# Patient Record
Sex: Male | Born: 1975
Health system: Southern US, Community
[De-identification: ages and names within clinical notes are randomized; demographics above are authoritative.]

---

## 2020-06-19 ENCOUNTER — Inpatient Hospital Stay (HOSPITAL_COMMUNITY): Payer: HRSA Program

## 2020-06-19 ENCOUNTER — Other Ambulatory Visit: Payer: Self-pay

## 2020-06-19 ENCOUNTER — Emergency Department (HOSPITAL_COMMUNITY): Payer: HRSA Program

## 2020-06-19 ENCOUNTER — Inpatient Hospital Stay (HOSPITAL_COMMUNITY)
Admission: EM | Admit: 2020-06-19 | Discharge: 2020-06-26 | DRG: 177 | Disposition: A | Discharge status: Home / Self Care | Payer: HRSA Program | Attending: Internal Medicine | Admitting: Internal Medicine

## 2020-06-19 ENCOUNTER — Encounter (HOSPITAL_COMMUNITY): Payer: Self-pay | Admitting: Emergency Medicine

## 2020-06-19 DIAGNOSIS — R7401 Elevation of levels of liver transaminase levels: Secondary | ICD-10-CM | POA: Diagnosis present

## 2020-06-19 DIAGNOSIS — G4700 Insomnia, unspecified: Secondary | ICD-10-CM | POA: Diagnosis present

## 2020-06-19 DIAGNOSIS — R7989 Other specified abnormal findings of blood chemistry: Secondary | ICD-10-CM | POA: Diagnosis present

## 2020-06-19 DIAGNOSIS — J1282 Pneumonia due to coronavirus disease 2019: Secondary | ICD-10-CM | POA: Diagnosis present

## 2020-06-19 DIAGNOSIS — R7881 Bacteremia: Secondary | ICD-10-CM | POA: Diagnosis not present

## 2020-06-19 DIAGNOSIS — U071 COVID-19: Principal | ICD-10-CM

## 2020-06-19 DIAGNOSIS — J9601 Acute respiratory failure with hypoxia: Secondary | ICD-10-CM | POA: Diagnosis present

## 2020-06-19 DIAGNOSIS — N179 Acute kidney failure, unspecified: Secondary | ICD-10-CM | POA: Diagnosis present

## 2020-06-19 LAB — HEPATITIS B SURFACE ANTIGEN: Hepatitis B Surface Ag: NONREACTIVE

## 2020-06-19 LAB — CBG MONITORING, ED: Glucose-Capillary: 153 mg/dL — ABNORMAL HIGH (ref 70–99)

## 2020-06-19 LAB — COMPREHENSIVE METABOLIC PANEL
ALT: 97 U/L — ABNORMAL HIGH (ref 0–44)
AST: 123 U/L — ABNORMAL HIGH (ref 15–41)
Albumin: 3.2 g/dL — ABNORMAL LOW (ref 3.5–5.0)
Alkaline Phosphatase: 62 U/L (ref 38–126)
Anion gap: 12 (ref 5–15)
BUN: 22 mg/dL — ABNORMAL HIGH (ref 6–20)
CO2: 27 mmol/L (ref 22–32)
Calcium: 8.3 mg/dL — ABNORMAL LOW (ref 8.9–10.3)
Chloride: 94 mmol/L — ABNORMAL LOW (ref 98–111)
Creatinine, Ser: 1.59 mg/dL — ABNORMAL HIGH (ref 0.61–1.24)
GFR calc Af Amer: >60 mL/min (ref >60)
GFR calc non Af Amer: 52 mL/min — ABNORMAL LOW (ref >60)
Glucose, Bld: 152 mg/dL — ABNORMAL HIGH (ref 70–99)
Potassium: 3.7 mmol/L (ref 3.5–5.1)
Sodium: 133 mmol/L — ABNORMAL LOW (ref 135–145)
Total Bilirubin: 1 mg/dL (ref 0.3–1.2)
Total Protein: 7 g/dL (ref 6.5–8.1)

## 2020-06-19 LAB — CBC WITH DIFFERENTIAL/PLATELET
Abs Immature Granulocytes: 0.02 10*3/uL (ref 0.00–0.07)
Basophils Absolute: 0 10*3/uL (ref 0.0–0.1)
Basophils Relative: 0 %
Eosinophils Absolute: 0 10*3/uL (ref 0.0–0.5)
Eosinophils Relative: 0 %
HCT: 47.9 % (ref 39.0–52.0)
Hemoglobin: 15.8 g/dL (ref 13.0–17.0)
Immature Granulocytes: 0 %
Lymphocytes Relative: 27 %
Lymphs Abs: 1.3 10*3/uL (ref 0.7–4.0)
MCH: 29.1 pg (ref 26.0–34.0)
MCHC: 33 g/dL (ref 30.0–36.0)
MCV: 88.2 fL (ref 80.0–100.0)
Monocytes Absolute: 0.8 10*3/uL (ref 0.1–1.0)
Monocytes Relative: 16 %
Neutro Abs: 2.7 10*3/uL (ref 1.7–7.7)
Neutrophils Relative %: 57 %
Platelets: 240 10*3/uL (ref 150–400)
RBC: 5.43 MIL/uL (ref 4.22–5.81)
RDW: 12.5 % (ref 11.5–15.5)
WBC: 4.8 10*3/uL (ref 4.0–10.5)
nRBC: 0 % (ref 0.0–0.2)

## 2020-06-19 LAB — I-STAT ARTERIAL BLOOD GAS, ED
Acid-Base Excess: 5 mmol/L — ABNORMAL HIGH (ref 0.0–2.0)
Bicarbonate: 29.5 mmol/L — ABNORMAL HIGH (ref 20.0–28.0)
Calcium, Ion: 1.09 mmol/L — ABNORMAL LOW (ref 1.15–1.40)
HCT: 45 % (ref 39.0–52.0)
Hemoglobin: 15.3 g/dL (ref 13.0–17.0)
O2 Saturation: 86 %
Patient temperature: 100.3
Potassium: 3.5 mmol/L (ref 3.5–5.1)
Sodium: 135 mmol/L (ref 135–145)
TCO2: 31 mmol/L (ref 22–32)
pCO2 arterial: 42.9 mmHg (ref 32.0–48.0)
pH, Arterial: 7.448 (ref 7.350–7.450)
pO2, Arterial: 51 mmHg — ABNORMAL LOW (ref 83.0–108.0)

## 2020-06-19 LAB — LACTATE DEHYDROGENASE: LDH: 588 U/L — ABNORMAL HIGH (ref 98–192)

## 2020-06-19 LAB — URINALYSIS, ROUTINE W REFLEX MICROSCOPIC
Bilirubin Urine: NEGATIVE
Glucose, UA: NEGATIVE mg/dL
Hgb urine dipstick: NEGATIVE
Ketones, ur: NEGATIVE mg/dL
Leukocytes,Ua: NEGATIVE
Nitrite: NEGATIVE
Protein, ur: 100 mg/dL — AB
Specific Gravity, Urine: 1.027 (ref 1.005–1.030)
pH: 5 (ref 5.0–8.0)

## 2020-06-19 LAB — SODIUM, URINE, RANDOM: Sodium, Ur: 20 mmol/L

## 2020-06-19 LAB — LACTIC ACID, PLASMA: Lactic Acid, Venous: 1.9 mmol/L (ref 0.5–1.9)

## 2020-06-19 LAB — SARS CORONAVIRUS 2 BY RT PCR (HOSPITAL ORDER, PERFORMED IN ~~LOC~~ HOSPITAL LAB): SARS Coronavirus 2: POSITIVE — AB

## 2020-06-19 LAB — FIBRINOGEN: Fibrinogen: 571 mg/dL — ABNORMAL HIGH (ref 210–475)

## 2020-06-19 LAB — TRIGLYCERIDES: Triglycerides: 131 mg/dL (ref <150)

## 2020-06-19 LAB — CREATININE, URINE, RANDOM: Creatinine, Urine: 275.99 mg/dL

## 2020-06-19 LAB — C-REACTIVE PROTEIN: CRP: 11.9 mg/dL — ABNORMAL HIGH (ref <1.0)

## 2020-06-19 LAB — PROCALCITONIN: Procalcitonin: 0.45 ng/mL

## 2020-06-19 LAB — STREP PNEUMONIAE URINARY ANTIGEN: Strep Pneumo Urinary Antigen: NEGATIVE

## 2020-06-19 LAB — D-DIMER, QUANTITATIVE: D-Dimer, Quant: 1.13 ug/mL-FEU — ABNORMAL HIGH (ref 0.00–0.50)

## 2020-06-19 LAB — HIV ANTIBODY (ROUTINE TESTING W REFLEX): HIV Screen 4th Generation wRfx: NONREACTIVE

## 2020-06-19 LAB — ABO/RH: ABO/RH(D): A POS

## 2020-06-19 LAB — FERRITIN: Ferritin: 1332 ng/mL — ABNORMAL HIGH (ref 24–336)

## 2020-06-19 MED ORDER — HYDROCOD POLST-CPM POLST ER 10-8 MG/5ML PO SUER: 5.0000 mL | Freq: Two times a day (BID) | ORAL | Status: DC | PRN

## 2020-06-19 MED ORDER — SODIUM CHLORIDE 0.9% FLUSH
3.0000 mL | Freq: Two times a day (BID) | INTRAVENOUS | Status: DC
  Administered 2020-06-20 – 2020-06-26 (×13): 3 mL via INTRAVENOUS

## 2020-06-19 MED ORDER — DEXAMETHASONE SODIUM PHOSPHATE 10 MG/ML IJ SOLN
6.0000 mg | Freq: Once | INTRAMUSCULAR | Status: AC
  Administered 2020-06-19: 6 mg via INTRAVENOUS
  Filled 2020-06-19: qty 1

## 2020-06-19 MED ORDER — DEXAMETHASONE 4 MG PO TABS: 6.0000 mg | ORAL_TABLET | ORAL | Status: DC

## 2020-06-19 MED ORDER — GUAIFENESIN-DM 100-10 MG/5ML PO SYRP: 10.0000 mL | ORAL_SOLUTION | ORAL | Status: DC | PRN

## 2020-06-19 MED ORDER — ACETAMINOPHEN 325 MG PO TABS: 650.0000 mg | ORAL_TABLET | Freq: Four times a day (QID) | ORAL | Status: DC | PRN

## 2020-06-19 MED ORDER — TECHNETIUM TO 99M ALBUMIN AGGREGATED
4.2000 | Freq: Once | INTRAVENOUS | Status: AC | PRN
  Administered 2020-06-19: 4.2 via INTRAVENOUS

## 2020-06-19 MED ORDER — SODIUM CHLORIDE 0.9 % IV SOLN: Freq: Once | INTRAVENOUS | Status: AC

## 2020-06-19 MED ORDER — SODIUM CHLORIDE 0.9 % IV SOLN
200.0000 mg | Freq: Once | INTRAVENOUS | Status: AC
  Administered 2020-06-19: 200 mg via INTRAVENOUS
  Filled 2020-06-19: qty 40

## 2020-06-19 MED ORDER — SODIUM CHLORIDE 0.9 % IV SOLN
2.0000 g | INTRAVENOUS | Status: DC
  Administered 2020-06-19 – 2020-06-22 (×4): 2 g via INTRAVENOUS
  Filled 2020-06-19 (×4): qty 20

## 2020-06-19 MED ORDER — ENOXAPARIN SODIUM 100 MG/ML ~~LOC~~ SOLN: 90.0000 mg | Freq: Two times a day (BID) | SUBCUTANEOUS | Status: DC

## 2020-06-19 MED ORDER — SODIUM CHLORIDE 0.9 % IV SOLN
500.0000 mg | INTRAVENOUS | Status: DC
  Administered 2020-06-19 – 2020-06-20 (×2): 500 mg via INTRAVENOUS
  Filled 2020-06-19 (×3): qty 500

## 2020-06-19 MED ORDER — ONDANSETRON HCL 4 MG PO TABS: 4.0000 mg | ORAL_TABLET | Freq: Four times a day (QID) | ORAL | Status: DC | PRN

## 2020-06-19 MED ORDER — ENOXAPARIN SODIUM 100 MG/ML ~~LOC~~ SOLN
90.0000 mg | Freq: Once | SUBCUTANEOUS | Status: DC
  Filled 2020-06-19: qty 0.9

## 2020-06-19 MED ORDER — ENOXAPARIN SODIUM 100 MG/ML ~~LOC~~ SOLN
90.0000 mg | Freq: Two times a day (BID) | SUBCUTANEOUS | Status: DC
  Administered 2020-06-19: 90 mg via SUBCUTANEOUS
  Filled 2020-06-19 (×2): qty 0.9

## 2020-06-19 MED ORDER — ENOXAPARIN SODIUM 100 MG/ML ~~LOC~~ SOLN
90.0000 mg | Freq: Once | SUBCUTANEOUS | Status: AC
  Administered 2020-06-19: 90 mg via SUBCUTANEOUS
  Filled 2020-06-19: qty 0.9

## 2020-06-19 MED ORDER — ZINC SULFATE 220 (50 ZN) MG PO CAPS
220.0000 mg | ORAL_CAPSULE | Freq: Every day | ORAL | Status: DC
  Administered 2020-06-19 – 2020-06-26 (×8): 220 mg via ORAL
  Filled 2020-06-19 (×8): qty 1

## 2020-06-19 MED ORDER — TOCILIZUMAB 400 MG/20ML IV SOLN
8.0000 mg/kg | Freq: Once | INTRAVENOUS | Status: AC
  Administered 2020-06-19: 726 mg via INTRAVENOUS
  Filled 2020-06-19: qty 20

## 2020-06-19 MED ORDER — ONDANSETRON HCL 4 MG/2ML IJ SOLN: 4.0000 mg | Freq: Four times a day (QID) | INTRAMUSCULAR | Status: DC | PRN

## 2020-06-19 MED ORDER — FAMOTIDINE 20 MG PO TABS
20.0000 mg | ORAL_TABLET | Freq: Two times a day (BID) | ORAL | Status: DC
  Administered 2020-06-19 – 2020-06-20 (×3): 20 mg via ORAL
  Filled 2020-06-19 (×3): qty 1

## 2020-06-19 MED ORDER — ALBUTEROL SULFATE HFA 108 (90 BASE) MCG/ACT IN AERS
2.0000 | INHALATION_SPRAY | Freq: Four times a day (QID) | RESPIRATORY_TRACT | Status: DC
  Administered 2020-06-19 – 2020-06-23 (×15): 2 via RESPIRATORY_TRACT
  Filled 2020-06-19 (×2): qty 6.7

## 2020-06-19 MED ORDER — SODIUM CHLORIDE 0.9 % IV SOLN
100.0000 mg | INTRAVENOUS | Status: AC
  Administered 2020-06-20 – 2020-06-23 (×4): 100 mg via INTRAVENOUS
  Filled 2020-06-19 (×4): qty 20

## 2020-06-19 MED ORDER — ASCORBIC ACID 500 MG PO TABS
500.0000 mg | ORAL_TABLET | Freq: Every day | ORAL | Status: DC
  Administered 2020-06-19 – 2020-06-26 (×8): 500 mg via ORAL
  Filled 2020-06-19 (×8): qty 1

## 2020-06-19 NOTE — H&P (Addendum)
History and Physical    Kenneth Hudson XNA:[id number] DOB: March 08, 1976 DOA: 06/19/2020  Referring MD/NP/PA: Alveria Apley, PA-C PCP: Patient, No Pcp Per  Patient coming from: Home via EMS  Chief Complaint: Shortness of breath  I have personally briefly reviewed patient's old medical records in [facility]   HPI: Kenneth Hudson is a 44 y.o. male 19+ past medical history significant for diagnosed with COVID-19 7/28 presents with progressively worsening shortness of breath.  Symptoms initially started 12 days ago and tested positive for COVID-19 virus 5 days ago.  Complains of associated symptoms of subjective fevers, chest pain, generalized malaise, intermittently productive cough, insomnia, and poor appetite.  Denies having any nausea, vomiting, diarrhea, calf pain symptoms.  He denies having any medical problems, but does not regularly see a primary care physician.  Notes that he never got around to get the COVID-19 vaccine.  He notes that none of his family lives nearby and he will update them.  ED Course: Upon admission into the emergency department patient was seen to have a temperature of 100.3 F, pulse 95-1 04, respirations 28-37, and O2 saturations as low as 35% and currently lower 90s on nonrebreather and he did high flow nasal cannula oxygen. Labs noted CBC within normal limits, sodium 133, BUN 22, creatinine 1.59, glucose 152, AST 123, ALT 97, lactic acid 1.9, LDH 588, D-dimer 1.13, and fibrinogen 571. Patient was started on remdesivir and given Decadron 6 mg IV. TRH called to admit  Review of Systems  Constitutional: Positive for fever and malaise/fatigue.  HENT: Negative for ear pain and tinnitus.   Eyes: Negative for photophobia and pain.  Respiratory: Positive for cough, sputum production and shortness of breath.   Cardiovascular: Positive for chest pain. Negative for leg swelling.  Gastrointestinal: Negative for abdominal pain, diarrhea, nausea and vomiting.  Genitourinary:  Negative for dysuria and urgency.  Musculoskeletal: Positive for myalgias. Negative for falls.  Skin: Negative for itching and rash.  Neurological: Positive for weakness. Negative for focal weakness and loss of consciousness.  Psychiatric/Behavioral: Negative for substance abuse. The patient has insomnia.     History reviewed. No pertinent past medical history.  History reviewed. No pertinent surgical history.   reports that he has never smoked. He has never used smokeless tobacco. He reports current alcohol use. He reports that he does not use drugs.  Not on File  No family history on file.  Prior to Admission medications   Not on File    Physical Exam:  Constitutional: Middle-age male who appears acutely ill in some respiratory distress Vitals:   06/19/20 0930 06/19/20 0945 06/19/20 1000 06/19/20 1045  BP: (!) 137/83 (!) 132/75 123/74 (!) 134/86  Pulse: 98 98 101 95  Resp: (!) 28 (!) 29 (!) 36 (!) 36  Temp:      TempSrc:      SpO2: 94% 91% 92% (!) 89%  Weight:       Eyes: PERRL, lids and conjunctivae normal ENMT: Mucous membranes are moist. Posterior pharynx clear of any exudate or lesions.  Neck: normal, supple, no masses, no thyromegaly Respiratory:Tachypneic with decreased aeration no significant wheezes rhonchi appreciated. Cardiovascular: Regular rate and rhythm, no murmurs / rubs / gallops. No extremity edema. 2+ pedal pulses. No carotid bruits.  Abdomen: no tenderness, no masses palpated. No hepatosplenomegaly. Bowel sounds positive.  Musculoskeletal: no clubbing / cyanosis. No joint deformity upper and lower extremities. Good ROM, no contractures. Normal muscle tone.  Skin: no rashes, lesions, ulcers. No induration  Neurologic: CN 2-12 grossly intact. Sensation intact, DTR normal. Strength 5/5 in all 4.  Psychiatric: Normal judgment and insight. Alert and oriented x 3. Normal mood.     Labs on Admission: I have personally reviewed following labs and imaging  studies  CBC: Recent Labs  Lab 06/19/20 0935  WBC 4.8  NEUTROABS 2.7  HGB 15.8  HCT 47.9  MCV 88.2  PLT 240   Basic Metabolic Panel: Recent Labs  Lab 06/19/20 0935  NA 133*  K 3.7  CL 94*  CO2 27  GLUCOSE 152*  BUN 22*  CREATININE 1.59*  CALCIUM 8.3*   GFR: CrCl cannot be calculated (Unknown ideal weight.). Liver Function Tests: Recent Labs  Lab 06/19/20 0935  AST 123*  ALT 97*  ALKPHOS 62  BILITOT 1.0  PROT 7.0  ALBUMIN 3.2*   No results for input(s): LIPASE, AMYLASE in the last 168 hours. No results for input(s): AMMONIA in the last 168 hours. Coagulation Profile: No results for input(s): INR, PROTIME in the last 168 hours. Cardiac Enzymes: No results for input(s): CKTOTAL, CKMB, CKMBINDEX, TROPONINI in the last 168 hours. BNP (last 3 results) No results for input(s): PROBNP in the last 8760 hours. HbA1C: No results for input(s): HGBA1C in the last 72 hours. CBG: Recent Labs  Lab 06/19/20 0857  GLUCAP 153*   Lipid Profile: No results for input(s): CHOL, HDL, LDLCALC, TRIG, CHOLHDL, LDLDIRECT in the last 72 hours. Thyroid Function Tests: No results for input(s): TSH, T4TOTAL, FREET4, T3FREE, THYROIDAB in the last 72 hours. Anemia Panel: No results for input(s): VITAMINB12, FOLATE, FERRITIN, TIBC, IRON, RETICCTPCT in the last 72 hours. Urine analysis: No results found for: COLORURINE, APPEARANCEUR, LABSPEC, PHURINE, GLUCOSEU, HGBUR, BILIRUBINUR, KETONESUR, PROTEINUR, UROBILINOGEN, NITRITE, LEUKOCYTESUR Sepsis Labs: No results found for this or any previous visit (from the past 240 hour(s)).   Radiological Exams on Admission: DG Chest Port 1 View  Result Date: 06/19/2020 CLINICAL DATA:  Shortness of breath, COVID positive EXAM: PORTABLE CHEST 1 VIEW COMPARISON:  None FINDINGS: Trachea midline.  Lung volumes are low. Basilar consolidation on the LEFT with some areas of peripheral opacity in the LEFT chest lateral to the heart border. Linear opacities  also in the RIGHT mid chest. No acute musculoskeletal process to the extent evaluated. IMPRESSION: Multifocal opacities a pattern that could be seen in the setting multifocal infection or in the setting of COVID-19, asymmetric and more dense opacification in the retrocardiac region on today's exam. Electronically Signed   By: Donzetta Kohut M.D.   On: 06/19/2020 10:00    EKG: Independently reviewed. Sinus tachycardia at 103 bpm  Assessment/Plan Acute respiratory failure with hypoxia secondary to pneumonia due to COVID-19: Patient presented with 12 days of symptoms after recent being diagnosed with COVID-19 7/28. He was noted to be hypoxic down to 35% on room air requiring heated high flow nasal cannula oxygen and nonrebreather mask to maintain O2 saturations in the lower 90s. Chest x-ray significant for multifocal opacities concerning for COVID-19. Patient had been started on remdesivir and Decadron. Inflammatory markers including D-dimer -Admit to a progressive bed -COVID-19 admission order set utilize -Continuous pulse oximetry with oxygen to maintain O2 saturations greater than 90% -Follow-up ABG -Follow-up blood and sputum cultures -Continue remdesivir -Decadron 6 mg p.o. daily -Albuterol inhaler every 6 hours -Actemra x1 dose, reassess for need of 2nd dose -Added on Rocephin and azithromycin given elevated procalcitonin of 0.45 -Antitussives as needed cough/congestion -Vitamin C and zinc -Tylenol as needed for fever -Daily  monitoring of inflammatory markers  Elevated D-dimer: Acute. D-dimer elevated 1.13. Given the degree of axial question possibility of blood clot. -Lovenox per pharmacy -Check VQ scan  Acute kidney injury: On admission patient found to have a creatinine of 1.59 with BUN 22. Patient baseline creatinine unknown. -Check urine sodium and urine creatinine to calculate FeNa -Give normal saline IV fluids at 75 mL/h x 1 L -Continue to monitor kidney function  daily  Transaminitis: Acute.  AST 123 and ALT 97.  Patient denies daily use of alcohol. -Continue to monitor  DVT prophylaxis: Lovenox per pharmacy Code Status: Full Family Communication: No family quested to be updated Disposition Plan: To be determined Consults called: None Admission status: Inpatient  Clydie Braun MD [facility] Pager [phone]   If 7PM-7AM, please contact night-coverage [email or web address] Password [facility]  06/19/2020, 11:31 AM

## 2020-06-19 NOTE — ED Triage Notes (Signed)
Pt in via [facility] w/sob, dx Coivd+ last Wednesday. C/o fever, unable to catch breath, sats were 35% on RA per EMS. Arrived to ED 85% on 15L NRB

## 2020-06-19 NOTE — ED Notes (Signed)
Lupita Leash, sister, [phone] would like an update when available

## 2020-06-19 NOTE — Progress Notes (Signed)
ANTICOAGULATION CONSULT NOTE - Initial Consult  Pharmacy Consult for lovenox Indication: r/o PE  Not on File  Patient Measurements: Weight: 90.7 kg (200 lb)  Vital Signs: Temp: 100.3 F (37.9 C) (08/02 0902) Temp Source: Axillary (08/02 0902) BP: 133/90 (08/02 1200) Pulse Rate: 93 (08/02 1200)  Labs: Recent Labs    06/19/20 0935  HGB 15.8  HCT 47.9  PLT 240  CREATININE 1.59*    CrCl cannot be calculated (Unknown ideal weight.).  Assessment: 72 yom presented to the ED with shortness of breath due to recent COVID-19 diagnosis. To start lovenox for r/o PE. Baseline CBC is WNL. He is not on anticoagulation PTA.   Goal of Therapy:  Anti-Xa level 0.6-1 units/ml 4hrs after LMWH dose given Monitor platelets by anticoagulation protocol: Yes   Plan:  Lovenox 90mg  SQ Q12H CBC Q72H F/u VQ scan results  Bailee Metter, 06/19/2020,12:14 PM

## 2020-06-19 NOTE — Progress Notes (Signed)
RT NOTES: BIB EMS on 100% NRB, Sats 87%. Placed and 15L [place] HFNC under NRB, sats now 92%. Will continue to monitor.

## 2020-06-19 NOTE — ED Provider Notes (Signed)
[facility] EMERGENCY DEPARTMENT Provider Note   CSN: [id number] Arrival date & time: 06/19/20  5170     History Chief Complaint  Patient presents with   Covid+    Kenneth Hudson is a 44 y.o. male presenting for evaluation of shortness of breath.  Level 5 caveat due to acuity of condition  Patient states he was diagnosed with Covid last Wednesday.  He has had approximately 12 days of shortness of breath, worsened acutely last night.  No chest pain.  No other medical problems, takes medications daily.  Does not smoke cigarettes.  Additional history obtained from triage note.  Per triage note, patient was 35% on room air.  this improved to 85% on 15 L nonrebreather.  HPI     History reviewed. No pertinent past medical history.  There are no problems to display for this patient.   History reviewed. No pertinent surgical history.     No family history on file.  Social History   Tobacco Use   Smoking status: Never Smoker   Smokeless tobacco: Never Used  Substance Use Topics   Alcohol use: Yes   Drug use: Never    Home Medications Prior to Admission medications   Not on File    Allergies    Patient has no allergy information on record.  Review of Systems   Review of Systems  Unable to perform ROS: Acuity of condition  Constitutional: Positive for fever.  Respiratory: Positive for shortness of breath.   All other systems reviewed and are negative.   Physical Exam Updated Vital Signs BP (!) 134/86    Pulse 95    Temp 100.3 F (37.9 C) (Axillary)    Resp (!) 36    Wt 90.7 kg    SpO2 (!) 89%   Physical Exam Vitals and nursing note reviewed.  Constitutional:      Appearance: He is well-developed. He is ill-appearing.     Comments: Appears ill  HENT:     Head: Normocephalic and atraumatic.  Eyes:     Extraocular Movements: Extraocular movements intact.     Conjunctiva/sclera: Conjunctivae normal.     Pupils: Pupils are equal,  round, and reactive to light.  Cardiovascular:     Rate and Rhythm: Normal rate and regular rhythm.     Pulses: Normal pulses.  Pulmonary:     Effort: Pulmonary effort is normal. No respiratory distress.     Breath sounds: Normal breath sounds. No wheezing.     Comments: Tachypneic in the mid 30s.  Sats in the low 90s on HFNC and nonrebreather. Diminished lung sounds throughout  Abdominal:     General: There is no distension.     Palpations: Abdomen is soft. There is no mass.     Tenderness: There is no abdominal tenderness. There is no guarding or rebound.  Musculoskeletal:        General: Normal range of motion.     Cervical back: Normal range of motion and neck supple.     Right lower leg: No edema.     Left lower leg: No edema.  Skin:    General: Skin is warm and dry.     Capillary Refill: Capillary refill takes less than 2 seconds.  Neurological:     Mental Status: He is alert and oriented to person, place, and time.     ED Results / Procedures / Treatments   Labs (all labs ordered are listed, but only abnormal results are displayed)  Labs Reviewed  COMPREHENSIVE METABOLIC PANEL - Abnormal; Notable for the following components:      Result Value   Sodium 133 (*)    Chloride 94 (*)    Glucose, Bld 152 (*)    BUN 22 (*)    Creatinine, Ser 1.59 (*)    Calcium 8.3 (*)    Albumin 3.2 (*)    AST 123 (*)    ALT 97 (*)    GFR calc non Af Amer 52 (*)    All other components within normal limits  D-DIMER, QUANTITATIVE (NOT AT [facility]) - Abnormal; Notable for the following components:   D-Dimer, Quant 1.13 (*)    All other components within normal limits  LACTATE DEHYDROGENASE - Abnormal; Notable for the following components:   LDH 588 (*)    All other components within normal limits  FIBRINOGEN - Abnormal; Notable for the following components:   Fibrinogen 571 (*)    All other components within normal limits  CBG MONITORING, ED - Abnormal; Notable for the following  components:   Glucose-Capillary 153 (*)    All other components within normal limits  SARS CORONAVIRUS 2 BY RT PCR (HOSPITAL ORDER, PERFORMED IN [facility])  CULTURE, BLOOD (ROUTINE X 2)  CULTURE, BLOOD (ROUTINE X 2)  LACTIC ACID, PLASMA  CBC WITH DIFFERENTIAL/PLATELET  PROCALCITONIN  FERRITIN  TRIGLYCERIDES  C-REACTIVE PROTEIN  I-STAT ARTERIAL BLOOD GAS, ED    EKG None  Radiology DG Chest Port 1 View  Result Date: 06/19/2020 CLINICAL DATA:  Shortness of breath, COVID positive EXAM: PORTABLE CHEST 1 VIEW COMPARISON:  None FINDINGS: Trachea midline.  Lung volumes are low. Basilar consolidation on the LEFT with some areas of peripheral opacity in the LEFT chest lateral to the heart border. Linear opacities also in the RIGHT mid chest. No acute musculoskeletal process to the extent evaluated. IMPRESSION: Multifocal opacities a pattern that could be seen in the setting multifocal infection or in the setting of COVID-19, asymmetric and more dense opacification in the retrocardiac region on today's exam. Electronically Signed   By: Donzetta Kohut M.D.   On: 06/19/2020 10:00    Procedures .Critical Care Performed by: Alveria Apley, PA-C Authorized by: Alveria Apley, PA-C   Critical care provider statement:    Critical care time (minutes):  35   Critical care time was exclusive of:  Separately billable procedures and treating other patients and teaching time   Critical care was necessary to treat or prevent imminent or life-threatening deterioration of the following conditions:  Respiratory failure   Critical care was time spent personally by me on the following activities:  Blood draw for specimens, development of treatment plan with patient or surrogate, discussions with primary provider, evaluation of patient's response to treatment, examination of patient, obtaining history from patient or surrogate, ordering and performing treatments and interventions, ordering and  review of radiographic studies, ordering and review of laboratory studies, pulse oximetry, review of old charts and re-evaluation of patient's condition   I assumed direction of critical care for this patient from another provider in my specialty: no   Comments:     Patient very hypoxic and tachypneic in the setting of a Covid infection.  Requiring high flow nasal cannula and nonrebreather and admission to the hospital.   (including critical care time)  Medications Ordered in ED Medications  remdesivir 200 mg in sodium chloride 0.9% 250 mL IVPB (200 mg Intravenous New Bag/Given 06/19/20 1046)    Followed by  remdesivir 100 mg in sodium chloride 0.9 % 100 mL IVPB (has no administration in time range)  dexamethasone (DECADRON) injection 6 mg (6 mg Intravenous Given 06/19/20 1046)    ED Course  I have reviewed the triage vital signs and the nursing notes.  Pertinent labs & imaging results that were available during my care of the patient were reviewed by me and considered in my medical decision making (see chart for details).    MDM Rules/Calculators/A&P                          Patient presented for evaluation of fever and shortness of breath.  On exam, patient appears ill.  Room air sats of 35%.  This improved with HFNC and nonrebreather.  Patient will need to be admitted after labs and x-ray return.  Will give Decadron and remdesivir  Chest x-ray viewed interpreted by me, shows multifocal opacity consistent with Covid pneumonia.  Labs show elevated inflammatory markers consistent with Covid.  Patient remains in low 90s on high flow nasal cannula and nonrebreather, no distress at this time.  Will call for admission.  Discussed with Dr. Katrinka Blazing from [facility], patient to be admitted.  Final Clinical Impression(s) / ED Diagnoses Final diagnoses:  COVID-19  Acute respiratory failure with hypoxia [facility])    Rx / DC Orders ED Discharge Orders    None       Alveria Apley, PA-C 06/19/20 1135    Sabas Sous, MD 06/28/20 [phone]

## 2020-06-20 LAB — CBC WITH DIFFERENTIAL/PLATELET
Abs Immature Granulocytes: 0.02 10*3/uL (ref 0.00–0.07)
Basophils Absolute: 0 10*3/uL (ref 0.0–0.1)
Basophils Relative: 0 %
Eosinophils Absolute: 0 10*3/uL (ref 0.0–0.5)
Eosinophils Relative: 0 %
HCT: 45.7 % (ref 39.0–52.0)
Hemoglobin: 14.7 g/dL (ref 13.0–17.0)
Immature Granulocytes: 1 %
Lymphocytes Relative: 28 %
Lymphs Abs: 0.9 10*3/uL (ref 0.7–4.0)
MCH: 28.1 pg (ref 26.0–34.0)
MCHC: 32.2 g/dL (ref 30.0–36.0)
MCV: 87.4 fL (ref 80.0–100.0)
Monocytes Absolute: 0.6 10*3/uL (ref 0.1–1.0)
Monocytes Relative: 20 %
Neutro Abs: 1.6 10*3/uL — ABNORMAL LOW (ref 1.7–7.7)
Neutrophils Relative %: 51 %
Platelets: 257 10*3/uL (ref 150–400)
RBC: 5.23 MIL/uL (ref 4.22–5.81)
RDW: 12.3 % (ref 11.5–15.5)
WBC: 3.1 10*3/uL — ABNORMAL LOW (ref 4.0–10.5)
nRBC: 0 % (ref 0.0–0.2)

## 2020-06-20 LAB — COMPREHENSIVE METABOLIC PANEL
ALT: 91 U/L — ABNORMAL HIGH (ref 0–44)
AST: 93 U/L — ABNORMAL HIGH (ref 15–41)
Albumin: 3 g/dL — ABNORMAL LOW (ref 3.5–5.0)
Alkaline Phosphatase: 57 U/L (ref 38–126)
Anion gap: 8 (ref 5–15)
BUN: 21 mg/dL — ABNORMAL HIGH (ref 6–20)
CO2: 30 mmol/L (ref 22–32)
Calcium: 8.4 mg/dL — ABNORMAL LOW (ref 8.9–10.3)
Chloride: 97 mmol/L — ABNORMAL LOW (ref 98–111)
Creatinine, Ser: 1.2 mg/dL (ref 0.61–1.24)
GFR calc Af Amer: >60 mL/min (ref >60)
GFR calc non Af Amer: >60 mL/min (ref >60)
Glucose, Bld: 153 mg/dL — ABNORMAL HIGH (ref 70–99)
Potassium: 3.9 mmol/L (ref 3.5–5.1)
Sodium: 135 mmol/L (ref 135–145)
Total Bilirubin: 1.1 mg/dL (ref 0.3–1.2)
Total Protein: 6.6 g/dL (ref 6.5–8.1)

## 2020-06-20 LAB — LEGIONELLA PNEUMOPHILA SEROGP 1 UR AG: L. pneumophila Serogp 1 Ur Ag: NEGATIVE

## 2020-06-20 LAB — D-DIMER, QUANTITATIVE: D-Dimer, Quant: 0.9 ug/mL-FEU — ABNORMAL HIGH (ref 0.00–0.50)

## 2020-06-20 LAB — C-REACTIVE PROTEIN: CRP: 9.4 mg/dL — ABNORMAL HIGH (ref <1.0)

## 2020-06-20 LAB — FERRITIN: Ferritin: 1373 ng/mL — ABNORMAL HIGH (ref 24–336)

## 2020-06-20 LAB — MAGNESIUM: Magnesium: 2.6 mg/dL — ABNORMAL HIGH (ref 1.7–2.4)

## 2020-06-20 LAB — PHOSPHORUS: Phosphorus: 2.8 mg/dL (ref 2.5–4.6)

## 2020-06-20 LAB — GLUCOSE, CAPILLARY: Glucose-Capillary: 187 mg/dL — ABNORMAL HIGH (ref 70–99)

## 2020-06-20 MED ORDER — METHYLPREDNISOLONE SODIUM SUCC 125 MG IJ SOLR
80.0000 mg | Freq: Three times a day (TID) | INTRAMUSCULAR | Status: AC
  Administered 2020-06-20 (×3): 80 mg via INTRAVENOUS
  Filled 2020-06-20 (×3): qty 2

## 2020-06-20 MED ORDER — PANTOPRAZOLE SODIUM 40 MG PO TBEC
40.0000 mg | DELAYED_RELEASE_TABLET | Freq: Every day | ORAL | Status: DC
  Administered 2020-06-20 – 2020-06-26 (×7): 40 mg via ORAL
  Filled 2020-06-20 (×7): qty 1

## 2020-06-20 MED ORDER — METHYLPREDNISOLONE SODIUM SUCC 125 MG IJ SOLR
60.0000 mg | Freq: Two times a day (BID) | INTRAMUSCULAR | Status: DC
  Administered 2020-06-21 – 2020-06-26 (×11): 60 mg via INTRAVENOUS
  Filled 2020-06-20 (×11): qty 2

## 2020-06-20 MED ORDER — ENOXAPARIN SODIUM 60 MG/0.6ML ~~LOC~~ SOLN
60.0000 mg | SUBCUTANEOUS | Status: DC
  Administered 2020-06-20 – 2020-06-25 (×6): 60 mg via SUBCUTANEOUS
  Filled 2020-06-20 (×6): qty 0.6

## 2020-06-20 NOTE — Progress Notes (Signed)
Pt. Arrived from ED. AOx4. Call light within reach, patient able to voice, and demonstrate understanding.  Skin, clean-dry- intact without evidence of bruising, or skin tears.    No evidence of skin break down noted on exam.  Will cont to eval and treat per MD orders.  Quincy Carnes, RN 06/20/2020 9:29 AM

## 2020-06-20 NOTE — Progress Notes (Addendum)
PROGRESS NOTE                                                                                                                                                                                                             Patient Demographics:    Kenneth Hudson, is a 44 y.o. male, DOB - 15-May-1976, UDJ:[id number]  Admit date - 06/19/2020   Admitting Physician Clydie Braun, MD  Outpatient Primary MD for the patient is Patient, No Pcp Per  LOS - 1   Chief Complaint  Patient presents with  . Covid+       Brief Narrative    Kenneth Hudson is a 44 y.o. male 19+ past medical history significant for diagnosed with COVID-19 7/28 presents with progressively worsening shortness of breath.  Symptoms initially started 12 days ago and tested positive for COVID-19 virus 5 days ago.  Complains of associated symptoms of subjective fevers, chest pain, generalized malaise, intermittently productive cough, insomnia, and poor appetite.  Denies having any nausea, vomiting, diarrhea, calf pain symptoms.  He denies having any medical problems, but does not regularly see a primary care physician.  Notes that he never got around to get the COVID-19 vaccine.  He notes that none of his family lives nearby and he will update them. - Upon admission into the emergency department patient was seen to have a temperature of 100.3 F, pulse 95-1 04, respirations 28-37, and O2 saturations as low as 35% and currently lower 90s on nonrebreather and he did high flow nasal cannula oxygen. Labs noted CBC within normal limits, sodium 133, BUN 22, creatinine 1.59, glucose 152, AST 123, ALT 97, lactic acid 1.9, LDH 588, D-dimer 1.13, and fibrinogen 571. Patient was started on remdesivir and given Decadron 6 mg IV.    Subjective:    Kenneth Hudson today has, No headache, No chest pain, No abdominal pain , reports his dyspnea is improving, still reports some cough.   Assessment  & Plan :    Principal  Problem:   Acute respiratory failure with hypoxia (HCC) Active Problems:   Pneumonia due to COVID-19 virus   AKI (acute kidney injury) (HCC)   Elevated d-dimer   Transaminitis  Acute respiratory failure with hypoxia secondary to pneumonia due to COVID-19. -Patient with significant hypoxia, still requiring 15 L high flow nasal cannula, and 15  L NRB. -Discussed at length with the patient, he was encouraged to prone, out of bed to chair, to use incentive spirometry and flutter valve. -Patient received Actemra on admission. -Continue with steroids, change to IV Solu-Medrol. -Continue with IV remdesivir. -Continue to trend inflammatory markers closely -Pro Cal of 0.5, continue with Rocephin and azithromycin for next 24 hours, and discontinue if continues to trend down. -VQ scan with no evidence of PE, will change full dose anticoagulation to prophylaxis dose  Elevated D-dimer - -VQ scan with no evidence of PE, will change full dose anticoagulation to prophylaxis dose  Acute kidney injury:  -Improving with IV fluids, will reassess his oral intake and appetite, if sufficient will hold on further IV fluids.  Transaminitis: Acute.  -Due to Covid, trending down.    COVID-19 Labs  Recent Labs    06/19/20 0935 06/20/20 0539  DDIMER 1.13* 0.90*  FERRITIN 1,332* 1,373*  LDH 588*  --   CRP 11.9* 9.4*    Lab Results  Component Value Date   SARSCOV2NAA POSITIVE (A) 06/19/2020     Code Status : Full  Family Communication  : Patient updated.  Disposition Plan  :  Status is: Inpatient  Remains inpatient appropriate because:IV treatments appropriate due to intensity of illness or inability to take PO   Dispo: The patient is from: Home              Anticipated d/c is to: Home              Anticipated d/c date is: > 3 days              Patient currently is not medically stable to d/c.       Consults  :  None  Procedures  : None  DVT Prophylaxis  :  [place]  lovenox  Lab Results  Component Value Date   PLT 257 06/20/2020    Antibiotics  :   Anti-infectives (From admission, onward)   Start     Dose/Rate Route Frequency Ordered Stop   06/20/20 1000  remdesivir 100 mg in sodium chloride 0.9 % 100 mL IVPB     Discontinue    "Followed by" Linked Group Details   100 mg 200 mL/hr over 30 Minutes Intravenous Every 24 hours 06/19/20 0942 06/24/20 0959   06/19/20 1400  cefTRIAXone (ROCEPHIN) 2 g in sodium chloride 0.9 % 100 mL IVPB     Discontinue     2 g 200 mL/hr over 30 Minutes Intravenous Every 24 hours 06/19/20 1330 06/24/20 1359   06/19/20 1400  azithromycin (ZITHROMAX) 500 mg in sodium chloride 0.9 % 250 mL IVPB     Discontinue     500 mg 250 mL/hr over 60 Minutes Intravenous Every 24 hours 06/19/20 1330 06/24/20 1359   06/19/20 1000  remdesivir 200 mg in sodium chloride 0.9% 250 mL IVPB       "Followed by" Linked Group Details   200 mg 580 mL/hr over 30 Minutes Intravenous Once 06/19/20 0942 06/19/20 1116        Objective:   Vitals:   06/20/20 0801 06/20/20 0802 06/20/20 1001 06/20/20 1049  BP: 130/69  (!) 142/94   Pulse: 67  75   Resp:   (!) 22   Temp:   97.7 F (36.5 C)   TempSrc:   Oral   SpO2: (!) 87% (!) 88% 95% 91%  Weight:      Height:        Wt  Readings from Last 3 Encounters:  06/19/20 122.5 kg     Intake/Output Summary (Last 24 hours) at 06/20/2020 1139 Last data filed at 06/19/2020 1730 Gross per 24 hour  Intake 688.75 ml  Output --  Net 688.75 ml     Physical Exam  Awake Alert, Oriented X 3, No new F.N deficits, Normal affect Symmetrical Chest wall movement, Good air movement bilaterally, CTAB RRR,No Gallops,Rubs or new Murmurs, No Parasternal Heave +ve B.Sounds, Abd Soft, No tenderness, No rebound - guarding or rigidity. No Cyanosis, Clubbing or edema, No new Rash or bruise     Data Review:    CBC Recent Labs  Lab 06/19/20 0935 06/19/20 1234 06/20/20 0539  WBC 4.8  --  3.1*  HGB 15.8 15.3  14.7  HCT 47.9 45.0 45.7  PLT 240  --  257  MCV 88.2  --  87.4  MCH 29.1  --  28.1  MCHC 33.0  --  32.2  RDW 12.5  --  12.3  LYMPHSABS 1.3  --  0.9  MONOABS 0.8  --  0.6  EOSABS 0.0  --  0.0  BASOSABS 0.0  --  0.0    Chemistries  Recent Labs  Lab 06/19/20 0935 06/19/20 1234 06/20/20 0539  NA 133* 135 135  K 3.7 3.5 3.9  CL 94*  --  97*  CO2 27  --  30  GLUCOSE 152*  --  153*  BUN 22*  --  21*  CREATININE 1.59*  --  1.20  CALCIUM 8.3*  --  8.4*  MG  --   --  2.6*  AST 123*  --  93*  ALT 97*  --  91*  ALKPHOS 62  --  57  BILITOT 1.0  --  1.1   ------------------------------------------------------------------------------------------------------------------ Recent Labs    06/19/20 0935  TRIG 131    No results found for: HGBA1C ------------------------------------------------------------------------------------------------------------------ No results for input(s): TSH, T4TOTAL, T3FREE, THYROIDAB in the last 72 hours.  Invalid input(s): FREET3 ------------------------------------------------------------------------------------------------------------------ Recent Labs    06/19/20 0935 06/20/20 0539  FERRITIN 1,332* 1,373*    Coagulation profile No results for input(s): INR, PROTIME in the last 168 hours.  Recent Labs    06/19/20 0935 06/20/20 0539  DDIMER 1.13* 0.90*    Cardiac Enzymes No results for input(s): CKMB, TROPONINI, MYOGLOBIN in the last 168 hours.  Invalid input(s): CK ------------------------------------------------------------------------------------------------------------------ No results found for: BNP  Inpatient Medications  Scheduled Meds: . albuterol  2 puff Inhalation Q6H  . vitamin C  500 mg Oral Daily  . enoxaparin (LOVENOX) injection  60 mg Subcutaneous Q24H  . methylPREDNISolone (SOLU-MEDROL) injection  80 mg Intravenous Q8H   Followed by  . [START ON 06/21/2020] methylPREDNISolone (SOLU-MEDROL) injection  60 mg  Intravenous Q12H  . pantoprazole  40 mg Oral Daily  . sodium chloride flush  3 mL Intravenous Q12H  . zinc sulfate  220 mg Oral Daily   Continuous Infusions: . azithromycin Stopped (06/19/20 1526)  . cefTRIAXone (ROCEPHIN)  IV Stopped (06/19/20 1456)  . remdesivir 100 mg in NS 100 mL 100 mg (06/20/20 1048)   PRN Meds:.acetaminophen, chlorpheniramine-HYDROcodone, guaiFENesin-dextromethorphan, ondansetron **OR** ondansetron (ZOFRAN) IV  Micro Results Recent Results (from the past 240 hour(s))  Blood Culture (routine x 2)     Status: None (Preliminary result)   Collection Time: 06/19/20  9:00 AM   Specimen: BLOOD  Result Value Ref Range Status   Specimen Description BLOOD LEFT ANTECUBITAL  Final   Special Requests  Final    BOTTLES DRAWN AEROBIC AND ANAEROBIC Blood Culture adequate volume   Culture  Setup Time   Final    GRAM POSITIVE COCCI IN CLUSTERS GRAM POSITIVE RODS IN BOTH AEROBIC AND ANAEROBIC BOTTLES CRITICAL RESULT CALLED TO, READ BACK BY AND VERIFIED WITH: A. Artis FlockWolfe PharmD 8:45 06/20/20 ([email or web address]) Performed at [facility], [address] [place], [place] [address]    Culture GRAM POSITIVE COCCI GRAM POSITIVE RODS   Final   Report Status PENDING  Incomplete  Blood Culture ID Panel (Reflexed)     Status: Abnormal   Collection Time: 06/19/20  9:00 AM  Result Value Ref Range Status   Enterococcus faecalis NOT DETECTED NOT DETECTED Final   Enterococcus Faecium NOT DETECTED NOT DETECTED Final   Listeria monocytogenes NOT DETECTED NOT DETECTED Final   Staphylococcus species DETECTED (A) NOT DETECTED Final    Comment: CRITICAL RESULT CALLED TO, READ BACK BY AND VERIFIED WITH: A. Artis FlockWolfe PharmD 8:45 06/20/20 ([email or web address])    Staphylococcus aureus (BCID) NOT DETECTED NOT DETECTED Final   Staphylococcus epidermidis DETECTED (A) NOT DETECTED Final   Staphylococcus lugdunensis NOT DETECTED NOT DETECTED Final   Streptococcus species NOT DETECTED NOT DETECTED Final   Streptococcus  agalactiae NOT DETECTED NOT DETECTED Final   Streptococcus pneumoniae NOT DETECTED NOT DETECTED Final   Streptococcus pyogenes NOT DETECTED NOT DETECTED Final   A.calcoaceticus-baumannii NOT DETECTED NOT DETECTED Final   Bacteroides fragilis NOT DETECTED NOT DETECTED Final   Enterobacterales NOT DETECTED NOT DETECTED Final   Enterobacter cloacae complex NOT DETECTED NOT DETECTED Final   Escherichia coli NOT DETECTED NOT DETECTED Final   Klebsiella aerogenes NOT DETECTED NOT DETECTED Final   Klebsiella oxytoca NOT DETECTED NOT DETECTED Final   Klebsiella pneumoniae NOT DETECTED NOT DETECTED Final   Proteus species NOT DETECTED NOT DETECTED Final   Salmonella species NOT DETECTED NOT DETECTED Final   Serratia marcescens NOT DETECTED NOT DETECTED Final   Haemophilus influenzae NOT DETECTED NOT DETECTED Final   Neisseria meningitidis NOT DETECTED NOT DETECTED Final   Pseudomonas aeruginosa NOT DETECTED NOT DETECTED Final   Stenotrophomonas maltophilia NOT DETECTED NOT DETECTED Final   Candida albicans NOT DETECTED NOT DETECTED Final   Candida auris NOT DETECTED NOT DETECTED Final   Candida glabrata NOT DETECTED NOT DETECTED Final   Candida krusei NOT DETECTED NOT DETECTED Final   Candida parapsilosis NOT DETECTED NOT DETECTED Final   Candida tropicalis NOT DETECTED NOT DETECTED Final   Cryptococcus neoformans/gattii NOT DETECTED NOT DETECTED Final   Methicillin resistance mecA/C NOT DETECTED NOT DETECTED Final    Comment: Performed at [facility], [address] [place], [place] [address]  SARS Coronavirus 2 by RT PCR (hospital order, performed in [facility] lab) Nasopharyngeal Nasopharyngeal Swab     Status: Abnormal   Collection Time: 06/19/20 10:38 AM   Specimen: Nasopharyngeal Swab  Result Value Ref Range Status   SARS Coronavirus 2 POSITIVE (A) NEGATIVE Final    Comment: RESULT CALLED TO, READ BACK BY AND VERIFIED WITH: Drema HalonK. Fields RN 14:20 06/19/20  ([email or web address]) (NOTE) SARS-CoV-2 target nucleic acids are DETECTED  SARS-CoV-2 RNA is generally detectable in upper respiratory specimens  during the acute phase of infection.  Positive results are indicative  of the presence of the identified virus, but do not rule out bacterial infection or co-infection with other pathogens not detected by the test.  Clinical correlation with patient history and  other diagnostic information is necessary to determine  patient infection status.  The expected result is negative.  Fact Sheet for Patients:   BoilerBrush.com.cy   Fact Sheet for Healthcare Providers:   https://pope.com/    This test is not yet approved or cleared by the [place] FDA and  has been authorized for detection and/or diagnosis of SARS-CoV-2 by FDA under an Emergency Use Authorization (EUA).  This EUA will remain in effect (meaning this t est can be used) for the duration of  the COVID-19 declaration under Section 564(b)(1) of the Act, 21 U.S.C. section 360-bbb-3(b)(1), unless the authorization is terminated or revoked sooner.  Performed at [facility], [address] [place], [place] [address]     Radiology Reports NM Pulmonary Perf and Vent  Result Date: 06/19/2020 CLINICAL DATA:  COVID-19 positive 06/14/2020, concern for pulmonary embolism EXAM: NUCLEAR MEDICINE PERFUSION LUNG SCAN TECHNIQUE: Perfusion images were obtained in multiple projections after intravenous injection of radiopharmaceutical. Ventilation scans intentionally deferred if perfusion scan and chest x-ray adequate for interpretation during COVID 19 epidemic. RADIOPHARMACEUTICALS:  4.2 mCi Tc-33m MAA IV COMPARISON:  Radiograph 06/19/2020 FINDINGS: No segmental perfusion defects are identified within the lungs. Demonstrated perfusion pattern corresponds well to the lung volumes seen on comparison chest radiograph from the same day. IMPRESSION: Pulmonary  embolism absent per the perfusion-only modified PIOPED II criteria. Electronically Signed   By: Kreg Shropshire M.D.   On: 06/19/2020 16:03   DG Chest Port 1 View  Result Date: 06/19/2020 CLINICAL DATA:  Shortness of breath, COVID positive EXAM: PORTABLE CHEST 1 VIEW COMPARISON:  None FINDINGS: Trachea midline.  Lung volumes are low. Basilar consolidation on the LEFT with some areas of peripheral opacity in the LEFT chest lateral to the heart border. Linear opacities also in the RIGHT mid chest. No acute musculoskeletal process to the extent evaluated. IMPRESSION: Multifocal opacities a pattern that could be seen in the setting multifocal infection or in the setting of COVID-19, asymmetric and more dense opacification in the retrocardiac region on today's exam. Electronically Signed   By: Donzetta Kohut M.D.   On: 06/19/2020 10:00    Huey Bienenstock M.D on 06/20/2020 at 11:39 AM    [facility] -  Office  [phone]

## 2020-06-20 NOTE — Progress Notes (Signed)
PHARMACY - PHYSICIAN COMMUNICATION CRITICAL VALUE ALERT - BLOOD CULTURE IDENTIFICATION (BCID)  Kenneth Hudson is an 44 y.o. male who presented to [facility] on 06/19/2020 with a chief complaint of shortness of breath 2/2 COVID-19 diagnosis.   Assessment:  BCx + for Staph epi in 1/4 bottles - no resistance detected Likely represents contamination   Name of physician (or Provider) Contacted: [name]  Current antibiotics: Ceftriaxone  Changes to prescribed antibiotics recommended:  Culture likely represents contamination and no antibiotics are needed at this time.  [name], PharmD PGY2 ID Pharmacy Resident [phone]  06/20/2020  9:07 AM

## 2020-06-20 NOTE — Plan of Care (Signed)
  Problem: Education: Goal: Knowledge of General Education information will improve Description: Including pain rating scale, medication(s)/side effects and non-pharmacologic comfort measures Outcome: Progressing   Problem: Health Behavior/Discharge Planning: Goal: Ability to manage health-related needs will improve Outcome: Progressing   Problem: Clinical Measurements: Goal: Ability to maintain clinical measurements within normal limits will improve Outcome: Progressing Goal: Will remain free from infection Outcome: Progressing Goal: Diagnostic test results will improve Outcome: Progressing Goal: Respiratory complications will improve Outcome: Progressing Goal: Cardiovascular complication will be avoided Outcome: Progressing   Problem: Activity: Goal: Risk for activity intolerance will decrease Outcome: Progressing   Problem: Nutrition: Goal: Adequate nutrition will be maintained Outcome: Progressing   Problem: Coping: Goal: Level of anxiety will decrease Outcome: Progressing   Problem: Elimination: Goal: Will not experience complications related to bowel motility Outcome: Progressing Goal: Will not experience complications related to urinary retention Outcome: Progressing   Problem: Pain Managment: Goal: General experience of comfort will improve Outcome: Progressing   Problem: Safety: Goal: Ability to remain free from injury will improve Outcome: Progressing   Problem: Skin Integrity: Goal: Risk for impaired skin integrity will decrease Outcome: Progressing   Problem: Education: Goal: Knowledge of risk factors and measures for prevention of condition will improve Outcome: Progressing   Problem: Respiratory: Goal: Will maintain a patent airway Outcome: Progressing   

## 2020-06-21 DIAGNOSIS — R7989 Other specified abnormal findings of blood chemistry: Secondary | ICD-10-CM

## 2020-06-21 DIAGNOSIS — J1282 Pneumonia due to coronavirus disease 2019: Secondary | ICD-10-CM

## 2020-06-21 DIAGNOSIS — J9601 Acute respiratory failure with hypoxia: Secondary | ICD-10-CM

## 2020-06-21 DIAGNOSIS — U071 COVID-19: Principal | ICD-10-CM

## 2020-06-21 LAB — PHOSPHORUS: Phosphorus: 2.9 mg/dL (ref 2.5–4.6)

## 2020-06-21 LAB — COMPREHENSIVE METABOLIC PANEL
ALT: 79 U/L — ABNORMAL HIGH (ref 0–44)
AST: 59 U/L — ABNORMAL HIGH (ref 15–41)
Albumin: 3 g/dL — ABNORMAL LOW (ref 3.5–5.0)
Alkaline Phosphatase: 50 U/L (ref 38–126)
Anion gap: 11 (ref 5–15)
BUN: 25 mg/dL — ABNORMAL HIGH (ref 6–20)
CO2: 28 mmol/L (ref 22–32)
Calcium: 8.5 mg/dL — ABNORMAL LOW (ref 8.9–10.3)
Chloride: 99 mmol/L (ref 98–111)
Creatinine, Ser: 1.23 mg/dL (ref 0.61–1.24)
GFR calc Af Amer: >60 mL/min (ref >60)
GFR calc non Af Amer: >60 mL/min (ref >60)
Glucose, Bld: 186 mg/dL — ABNORMAL HIGH (ref 70–99)
Potassium: 3.9 mmol/L (ref 3.5–5.1)
Sodium: 138 mmol/L (ref 135–145)
Total Bilirubin: 0.9 mg/dL (ref 0.3–1.2)
Total Protein: 6.3 g/dL — ABNORMAL LOW (ref 6.5–8.1)

## 2020-06-21 LAB — CBC WITH DIFFERENTIAL/PLATELET
Abs Immature Granulocytes: 0.04 10*3/uL (ref 0.00–0.07)
Basophils Absolute: 0 10*3/uL (ref 0.0–0.1)
Basophils Relative: 0 %
Eosinophils Absolute: 0 10*3/uL (ref 0.0–0.5)
Eosinophils Relative: 0 %
HCT: 44.5 % (ref 39.0–52.0)
Hemoglobin: 15 g/dL (ref 13.0–17.0)
Immature Granulocytes: 1 %
Lymphocytes Relative: 15 %
Lymphs Abs: 0.8 10*3/uL (ref 0.7–4.0)
MCH: 29.1 pg (ref 26.0–34.0)
MCHC: 33.7 g/dL (ref 30.0–36.0)
MCV: 86.2 fL (ref 80.0–100.0)
Monocytes Absolute: 0.6 10*3/uL (ref 0.1–1.0)
Monocytes Relative: 11 %
Neutro Abs: 4.1 10*3/uL (ref 1.7–7.7)
Neutrophils Relative %: 73 %
Platelets: 343 10*3/uL (ref 150–400)
RBC: 5.16 MIL/uL (ref 4.22–5.81)
RDW: 12.3 % (ref 11.5–15.5)
WBC: 5.6 10*3/uL (ref 4.0–10.5)
nRBC: 0 % (ref 0.0–0.2)

## 2020-06-21 LAB — MAGNESIUM: Magnesium: 2.4 mg/dL (ref 1.7–2.4)

## 2020-06-21 LAB — EXPECTORATED SPUTUM ASSESSMENT W GRAM STAIN, RFLX TO RESP C: Special Requests: NORMAL

## 2020-06-21 LAB — PROCALCITONIN: Procalcitonin: 0.13 ng/mL

## 2020-06-21 LAB — D-DIMER, QUANTITATIVE: D-Dimer, Quant: 0.59 ug/mL-FEU — ABNORMAL HIGH (ref 0.00–0.50)

## 2020-06-21 LAB — FERRITIN: Ferritin: 1084 ng/mL — ABNORMAL HIGH (ref 24–336)

## 2020-06-21 LAB — C-REACTIVE PROTEIN: CRP: 4.9 mg/dL — ABNORMAL HIGH (ref <1.0)

## 2020-06-21 MED ORDER — AZITHROMYCIN 500 MG PO TABS
500.0000 mg | ORAL_TABLET | ORAL | Status: DC
  Administered 2020-06-21 – 2020-06-22 (×2): 500 mg via ORAL
  Filled 2020-06-21 (×2): qty 1

## 2020-06-21 NOTE — Progress Notes (Signed)
PROGRESS NOTE    Avi Kerschner  GMW:[id number] DOB: 02-22-1976 DOA: 06/19/2020 PCP: Patient, No Pcp Per     Brief Narrative:  44 y.o.BM PMHx no significant past medical history   diagnosed with COVID-19 7/28 presents withprogressively worseningshortness of breath. Symptoms initially started 12 days ago and tested positive for COVID-19 virus 5 days ago. Complains of associated symptoms of subjective fevers, chest pain, generalized malaise, intermittently productive cough, insomnia, and poor appetite. Denies having any nausea, vomiting, diarrhea, calf pain symptoms. He denies having any medical problems, but does not regularly see a primary care physician. Notes that he never got around to get the COVID-19 vaccine. He notes that none of his family lives nearbyand he willupdate them. - Upon admission into the emergency department patient was seen to have a temperature of 100.3 F, pulse 95-1 04, respirations 28-37, and O2 saturations as low as 35% and currently lower 90s on nonrebreather and he did high flow nasal cannula oxygen. Labs noted CBC within normal limits, sodium 133, BUN 22, creatinine 1.59, glucose 152, AST 123, ALT 97, lactic acid 1.9, LDH 588, D-dimer 1.13,andfibrinogen 571. Patient was started on remdesivir and given Decadron 6 mg IV.    Subjective: A/O x4, positive S OB, negative CP, negative abdominal pain, negative nausea, negative vomiting   Assessment & Plan: Covid vaccination; negative vaccination   Principal Problem:   Acute respiratory failure with hypoxia (HCC) Active Problems:   Pneumonia due to COVID-19 virus   AKI (acute kidney injury) (HCC)   Elevated d-dimer   Transaminitis  Acute respiratory failure with hypoxia/Covid 19 pneumonia COVID-19 Labs  Recent Labs    06/19/20 0935 06/20/20 0539 06/21/20 0346  DDIMER 1.13* 0.90* 0.59*  FERRITIN 1,332* 1,373* 1,084*  LDH 588*  --   --   CRP 11.9* 9.4* 4.9*    Lab Results  Component Value Date     SARSCOV2NAA POSITIVE (A) 06/19/2020  . -Encouraged to prone, out of bed to chair, to use incentive spirometry and flutter valve. -Patient received Actemra on admission. -Continue with steroids, change to IV Solu-Medrol. -Continue with IV remdesivir. -Continue to trend inflammatory markers closely -Pro Cal of 0.5, continue with Rocephin and azithromycin for next 24 hours, and discontinue if continues to trend down. -VQ scan with no evidence of PE, will change full dose anticoagulation to prophylaxis dose  Elevated D-dimer - -VQ scan with no evidence of PE, will change full dose anticoagulation to prophylaxis dose  Acute kidney injury:  -Improving with IV fluids, will reassess his oral intake and appetite, if sufficient will hold on further IV fluids.  Transaminitis: Acute.  -Due to Covid, trending down.    DVT prophylaxis: Lovenox Code Status: Full Family Communication:  Status is: Inpatient    Dispo: The patient is from:               Anticipated d/c is to:               Anticipated d/c date is:               Patient currently       Consultants:    Procedures/Significant Events:    I have personally reviewed and interpreted all radiology studies and my findings are as above.  VENTILATOR SETTINGS: HFNC 8/4 Flow; 15 L/min SPO2; 90%   Cultures   Antimicrobials: Anti-infectives (From admission, onward)   Start     Ordered Stop   06/21/20 1400  azithromycin (ZITHROMAX) tablet 500 mg  Discontinue     06/21/20 0945 06/24/20 1359   06/20/20 1000  remdesivir 100 mg in sodium chloride 0.9 % 100 mL IVPB     Discontinue    "Followed by" Linked Group Details   06/19/20 0942 06/24/20 0959   06/19/20 1400  cefTRIAXone (ROCEPHIN) 2 g in sodium chloride 0.9 % 100 mL IVPB     Discontinue     06/19/20 1330 06/24/20 1359   06/19/20 1400  azithromycin (ZITHROMAX) 500 mg in sodium chloride 0.9 % 250 mL IVPB  Status:  Discontinued        06/19/20 1330 06/21/20  0945   06/19/20 1000  remdesivir 200 mg in sodium chloride 0.9% 250 mL IVPB       "Followed by" Linked Group Details   06/19/20 0942 06/19/20 1116       Devices    LINES / TUBES:      Continuous Infusions: . cefTRIAXone (ROCEPHIN)  IV 2 g (06/21/20 1448)  . remdesivir 100 mg in NS 100 mL 100 mg (06/21/20 0906)     Objective: Vitals:   06/21/20 0119 06/21/20 0330 06/21/20 0337 06/21/20 0444  BP: 130/81   (!) 144/84  Pulse: 70 70 67 71  Resp: (!) 22 (!) 26 (!) 21 19  Temp: 98.1 F (36.7 C)   98.7 F (37.1 C)  TempSrc: Oral   Oral  SpO2: 95% (!) 86% 93% 90%  Weight:    120.1 kg  Height:        Intake/Output Summary (Last 24 hours) at 06/21/2020 1905 Last data filed at 06/21/2020 1217 Gross per 24 hour  Intake 530 ml  Output 3750 ml  Net -3220 ml   Filed Weights   06/19/20 0904 06/19/20 2356 06/21/20 0444  Weight: 90.7 kg 122.5 kg 120.1 kg    Examination:  General: A/O x4, No acute respiratory distress Eyes: negative scleral hemorrhage, negative anisocoria, negative icterus ENT: Negative Runny nose, negative gingival bleeding, Neck:  Negative scars, masses, torticollis, lymphadenopathy, JVD Lungs: decreased air movement bilaterally without wheezes or crackles Cardiovascular: Regular rate and rhythm without murmur gallop or rub normal S1 and S2 Abdomen: negative abdominal pain, nondistended, positive soft, bowel sounds, no rebound, no ascites, no appreciable mass Extremities: No significant cyanosis, clubbing, or edema bilateral lower extremities Skin: Negative rashes, lesions, ulcers Psychiatric:  Negative depression, negative anxiety, negative fatigue, negative mania  Central nervous system:  Cranial nerves II through XII intact, tongue/uvula midline, all extremities muscle strength 5/5, sensation intact throughout, negative dysarthria, negative expressive aphasia, negative receptive aphasia.  .     Data Reviewed: Care during the described time interval was  provided by me .  I have reviewed this patient's available data, including medical history, events of note, physical examination, and all test results as part of my evaluation.  CBC: Recent Labs  Lab 06/19/20 0935 06/19/20 1234 06/20/20 0539 06/21/20 0346  WBC 4.8  --  3.1* 5.6  NEUTROABS 2.7  --  1.6* 4.1  HGB 15.8 15.3 14.7 15.0  HCT 47.9 45.0 45.7 44.5  MCV 88.2  --  87.4 86.2  PLT 240  --  257 343   Basic Metabolic Panel: Recent Labs  Lab 06/19/20 0935 06/19/20 1234 06/20/20 0539 06/21/20 0346  NA 133* 135 135 138  K 3.7 3.5 3.9 3.9  CL 94*  --  97* 99  CO2 27  --  30 28  GLUCOSE 152*  --  153* 186*  BUN 22*  --  21* 25*  CREATININE 1.59*  --  1.20 1.23  CALCIUM 8.3*  --  8.4* 8.5*  MG  --   --  2.6* 2.4  PHOS  --   --  2.8 2.9   GFR: Estimated Creatinine Clearance: 102.5 mL/min (by C-G formula based on SCr of 1.23 mg/dL). Liver Function Tests: Recent Labs  Lab 06/19/20 0935 06/20/20 0539 06/21/20 0346  AST 123* 93* 59*  ALT 97* 91* 79*  ALKPHOS 62 57 50  BILITOT 1.0 1.1 0.9  PROT 7.0 6.6 6.3*  ALBUMIN 3.2* 3.0* 3.0*   No results for input(s): LIPASE, AMYLASE in the last 168 hours. No results for input(s): AMMONIA in the last 168 hours. Coagulation Profile: No results for input(s): INR, PROTIME in the last 168 hours. Cardiac Enzymes: No results for input(s): CKTOTAL, CKMB, CKMBINDEX, TROPONINI in the last 168 hours. BNP (last 3 results) No results for input(s): PROBNP in the last 8760 hours. HbA1C: No results for input(s): HGBA1C in the last 72 hours. CBG: Recent Labs  Lab 06/19/20 0857 06/20/20 2057  GLUCAP 153* 187*   Lipid Profile: Recent Labs    06/19/20 0935  TRIG 131   Thyroid Function Tests: No results for input(s): TSH, T4TOTAL, FREET4, T3FREE, THYROIDAB in the last 72 hours. Anemia Panel: Recent Labs    06/20/20 0539 06/21/20 0346  FERRITIN 1,373* 1,084*   Sepsis Labs: Recent Labs  Lab 06/19/20 0935 06/21/20 0346    PROCALCITON 0.45 0.13  LATICACIDVEN 1.9  --     Recent Results (from the past 240 hour(s))  Blood Culture (routine x 2)     Status: None (Preliminary result)   Collection Time: 06/19/20  9:00 AM   Specimen: BLOOD  Result Value Ref Range Status   Specimen Description BLOOD LEFT ANTECUBITAL  Final   Special Requests   Final    BOTTLES DRAWN AEROBIC AND ANAEROBIC Blood Culture adequate volume   Culture  Setup Time   Final    GRAM POSITIVE COCCI IN CLUSTERS GRAM POSITIVE RODS IN BOTH AEROBIC AND ANAEROBIC BOTTLES CRITICAL RESULT CALLED TO, READ BACK BY AND VERIFIED WITH: A. Artis Flock PharmD 8:45 06/20/20 ([email or web address]) Performed at [facility], [address], [place], [place] [address]    Culture GRAM POSITIVE COCCI GRAM POSITIVE RODS   Final   Report Status PENDING  Incomplete  Blood Culture ID Panel (Reflexed)     Status: Abnormal   Collection Time: 06/19/20  9:00 AM  Result Value Ref Range Status   Enterococcus faecalis NOT DETECTED NOT DETECTED Final   Enterococcus Faecium NOT DETECTED NOT DETECTED Final   Listeria monocytogenes NOT DETECTED NOT DETECTED Final   Staphylococcus species DETECTED (A) NOT DETECTED Final    Comment: CRITICAL RESULT CALLED TO, READ BACK BY AND VERIFIED WITH: A. Artis Flock PharmD 8:45 06/20/20 ([email or web address])    Staphylococcus aureus (BCID) NOT DETECTED NOT DETECTED Final   Staphylococcus epidermidis DETECTED (A) NOT DETECTED Final   Staphylococcus lugdunensis NOT DETECTED NOT DETECTED Final   Streptococcus species NOT DETECTED NOT DETECTED Final   Streptococcus agalactiae NOT DETECTED NOT DETECTED Final   Streptococcus pneumoniae NOT DETECTED NOT DETECTED Final   Streptococcus pyogenes NOT DETECTED NOT DETECTED Final   A.calcoaceticus-baumannii NOT DETECTED NOT DETECTED Final   Bacteroides fragilis NOT DETECTED NOT DETECTED Final   Enterobacterales NOT DETECTED NOT DETECTED Final   Enterobacter cloacae complex NOT DETECTED NOT DETECTED Final   Escherichia  coli NOT DETECTED NOT DETECTED Final   Klebsiella aerogenes  NOT DETECTED NOT DETECTED Final   Klebsiella oxytoca NOT DETECTED NOT DETECTED Final   Klebsiella pneumoniae NOT DETECTED NOT DETECTED Final   Proteus species NOT DETECTED NOT DETECTED Final   Salmonella species NOT DETECTED NOT DETECTED Final   Serratia marcescens NOT DETECTED NOT DETECTED Final   Haemophilus influenzae NOT DETECTED NOT DETECTED Final   Neisseria meningitidis NOT DETECTED NOT DETECTED Final   Pseudomonas aeruginosa NOT DETECTED NOT DETECTED Final   Stenotrophomonas maltophilia NOT DETECTED NOT DETECTED Final   Candida albicans NOT DETECTED NOT DETECTED Final   Candida auris NOT DETECTED NOT DETECTED Final   Candida glabrata NOT DETECTED NOT DETECTED Final   Candida krusei NOT DETECTED NOT DETECTED Final   Candida parapsilosis NOT DETECTED NOT DETECTED Final   Candida tropicalis NOT DETECTED NOT DETECTED Final   Cryptococcus neoformans/gattii NOT DETECTED NOT DETECTED Final   Methicillin resistance mecA/C NOT DETECTED NOT DETECTED Final    Comment: Performed at [facility], [address], [place], [place] [address]  Blood Culture (routine x 2)     Status: None (Preliminary result)   Collection Time: 06/19/20  9:08 AM   Specimen: BLOOD  Result Value Ref Range Status   Specimen Description BLOOD RIGHT ANTECUBITAL  Final   Special Requests   Final    BOTTLES DRAWN AEROBIC AND ANAEROBIC Blood Culture adequate volume   Culture   Final    NO GROWTH 2 DAYS Performed at [facility], [address] [place], [place] [address]    Report Status PENDING  Incomplete  SARS Coronavirus 2 by RT PCR (hospital order, performed in [facility] hospital lab) Nasopharyngeal Nasopharyngeal Swab     Status: Abnormal   Collection Time: 06/19/20 10:38 AM   Specimen: Nasopharyngeal Swab  Result Value Ref Range Status   SARS Coronavirus 2 POSITIVE (A) NEGATIVE Final    Comment: RESULT CALLED TO, READ BACK BY AND  VERIFIED WITH: Drema Halon RN 14:20 06/19/20 ([email or web address]) (NOTE) SARS-CoV-2 target nucleic acids are DETECTED  SARS-CoV-2 RNA is generally detectable in upper respiratory specimens  during the acute phase of infection.  Positive results are indicative  of the presence of the identified virus, but do not rule out bacterial infection or co-infection with other pathogens not detected by the test.  Clinical correlation with patient history and  other diagnostic information is necessary to determine patient infection status.  The expected result is negative.  Fact Sheet for Patients:   BoilerBrush.com.cy   Fact Sheet for Healthcare Providers:   https://pope.com/    This test is not yet approved or cleared by the [place] FDA and  has been authorized for detection and/or diagnosis of SARS-CoV-2 by FDA under an Emergency Use Authorization (EUA).  This EUA will remain in effect (meaning this t est can be used) for the duration of  the COVID-19 declaration under Section 564(b)(1) of the Act, 21 U.S.C. section 360-bbb-3(b)(1), unless the authorization is terminated or revoked sooner.  Performed at [facility], [address] [place], [place] [address]   Expectorated sputum assessment w rflx to resp cult     Status: None   Collection Time: 06/21/20  9:16 AM   Specimen: Expectorated Sputum  Result Value Ref Range Status   Specimen Description Expect. Sput  Final   Special Requests Normal  Final   Sputum evaluation   Final    THIS SPECIMEN IS ACCEPTABLE FOR SPUTUM CULTURE Performed at [facility], [address] [place], [place] [address]    Report Status 06/21/2020 FINAL  Final  Culture, respiratory     Status: None (Preliminary result)   Collection Time: 06/21/20  9:16 AM  Result Value Ref Range Status   Specimen Description Expect. Sput  Final   Special Requests Normal Reflexed from [id number]  Final   Gram Stain   Final     RARE WBC PRESENT, PREDOMINANTLY PMN FEW GRAM POSITIVE COCCI IN PAIRS IN CHAINS RARE GRAM NEGATIVE RODS RARE GRAM POSITIVE RODS Performed at [facility], [address] [place], [place] [address]    Culture PENDING  Incomplete   Report Status PENDING  Incomplete         Radiology Studies: No results found.      Scheduled Meds: . albuterol  2 puff Inhalation Q6H  . vitamin C  500 mg Oral Daily  . azithromycin  500 mg Oral Q24H  . enoxaparin (LOVENOX) injection  60 mg Subcutaneous Q24H  . methylPREDNISolone (SOLU-MEDROL) injection  60 mg Intravenous Q12H  . pantoprazole  40 mg Oral Daily  . sodium chloride flush  3 mL Intravenous Q12H  . zinc sulfate  220 mg Oral Daily   Continuous Infusions: . cefTRIAXone (ROCEPHIN)  IV 2 g (06/21/20 1448)  . remdesivir 100 mg in NS 100 mL 100 mg (06/21/20 0906)     LOS: 2 days    Time spent:40 min    Jashon Ishida, Roselind MessierURTIS J, MD [facility] Pager [phone]  If 7PM-7AM, please contact night-coverage [email or web address] Password [email or web address] 06/21/2020, 7:05 PM

## 2020-06-21 NOTE — Care Management (Signed)
An appointment has been made for you at the [facility] on Friday the 13th at 9am. They will assist you with finding a PCP.  AVS updated

## 2020-06-22 LAB — COMPREHENSIVE METABOLIC PANEL
ALT: 72 U/L — ABNORMAL HIGH (ref 0–44)
AST: 44 U/L — ABNORMAL HIGH (ref 15–41)
Albumin: 3 g/dL — ABNORMAL LOW (ref 3.5–5.0)
Alkaline Phosphatase: 46 U/L (ref 38–126)
Anion gap: 11 (ref 5–15)
BUN: 22 mg/dL — ABNORMAL HIGH (ref 6–20)
CO2: 28 mmol/L (ref 22–32)
Calcium: 8.5 mg/dL — ABNORMAL LOW (ref 8.9–10.3)
Chloride: 99 mmol/L (ref 98–111)
Creatinine, Ser: 1.1 mg/dL (ref 0.61–1.24)
GFR calc Af Amer: >60 mL/min (ref >60)
GFR calc non Af Amer: >60 mL/min (ref >60)
Glucose, Bld: 169 mg/dL — ABNORMAL HIGH (ref 70–99)
Potassium: 3.8 mmol/L (ref 3.5–5.1)
Sodium: 138 mmol/L (ref 135–145)
Total Bilirubin: 0.9 mg/dL (ref 0.3–1.2)
Total Protein: 6.4 g/dL — ABNORMAL LOW (ref 6.5–8.1)

## 2020-06-22 LAB — FERRITIN: Ferritin: 814 ng/mL — ABNORMAL HIGH (ref 24–336)

## 2020-06-22 LAB — CBC WITH DIFFERENTIAL/PLATELET
Abs Immature Granulocytes: 0.18 10*3/uL — ABNORMAL HIGH (ref 0.00–0.07)
Basophils Absolute: 0 10*3/uL (ref 0.0–0.1)
Basophils Relative: 0 %
Eosinophils Absolute: 0 10*3/uL (ref 0.0–0.5)
Eosinophils Relative: 0 %
HCT: 45.5 % (ref 39.0–52.0)
Hemoglobin: 15.1 g/dL (ref 13.0–17.0)
Immature Granulocytes: 2 %
Lymphocytes Relative: 9 %
Lymphs Abs: 1.1 10*3/uL (ref 0.7–4.0)
MCH: 28.8 pg (ref 26.0–34.0)
MCHC: 33.2 g/dL (ref 30.0–36.0)
MCV: 86.8 fL (ref 80.0–100.0)
Monocytes Absolute: 1.1 10*3/uL — ABNORMAL HIGH (ref 0.1–1.0)
Monocytes Relative: 10 %
Neutro Abs: 9 10*3/uL — ABNORMAL HIGH (ref 1.7–7.7)
Neutrophils Relative %: 79 %
Platelets: 357 10*3/uL (ref 150–400)
RBC: 5.24 MIL/uL (ref 4.22–5.81)
RDW: 12.2 % (ref 11.5–15.5)
WBC: 11.3 10*3/uL — ABNORMAL HIGH (ref 4.0–10.5)
nRBC: 0 % (ref 0.0–0.2)

## 2020-06-22 LAB — PHOSPHORUS: Phosphorus: 3 mg/dL (ref 2.5–4.6)

## 2020-06-22 LAB — D-DIMER, QUANTITATIVE: D-Dimer, Quant: 0.52 ug/mL-FEU — ABNORMAL HIGH (ref 0.00–0.50)

## 2020-06-22 LAB — C-REACTIVE PROTEIN: CRP: 1.9 mg/dL — ABNORMAL HIGH (ref <1.0)

## 2020-06-22 LAB — MAGNESIUM: Magnesium: 2.4 mg/dL (ref 1.7–2.4)

## 2020-06-22 LAB — PROCALCITONIN: Procalcitonin: <0.1 ng/mL

## 2020-06-22 NOTE — Progress Notes (Signed)
PROGRESS NOTE    Kenneth Hudson  ZOX:[id number] DOB: 05-02-1976 DOA: 06/19/2020 PCP: Patient, No Pcp Per     Brief Narrative:  44 y.o.BM PMHx no significant past medical history   diagnosed with COVID-19 7/28 presents withprogressively worseningshortness of breath. Symptoms initially started 12 days ago and tested positive for COVID-19 virus 5 days ago. Complains of associated symptoms of subjective fevers, chest pain, generalized malaise, intermittently productive cough, insomnia, and poor appetite. Denies having any nausea, vomiting, diarrhea, calf pain symptoms. He denies having any medical problems, but does not regularly see a primary care physician. Notes that he never got around to get the COVID-19 vaccine. He notes that none of his family lives nearbyand he willupdate them. - Upon admission into the emergency department patient was seen to have a temperature of 100.3 F, pulse 95-1 04, respirations 28-37, and O2 saturations as low as 35% and currently lower 90s on nonrebreather and he did high flow nasal cannula oxygen. Labs noted CBC within normal limits, sodium 133, BUN 22, creatinine 1.59, glucose 152, AST 123, ALT 97, lactic acid 1.9, LDH 588, D-dimer 1.13,andfibrinogen 571. Patient was started on remdesivir and given Decadron 6 mg IV.    Subjective: 8/5 afebrile A/O x4 positive S OB, negative CP, negative abdominal pain, negative nausea, negative vomiting   Assessment & Plan: Covid vaccination; negative vaccination   Principal Problem:   Acute respiratory failure with hypoxia (HCC) Active Problems:   Pneumonia due to COVID-19 virus   AKI (acute kidney injury) (HCC)   Elevated d-dimer   Transaminitis  Acute respiratory failure with hypoxia/Covid 19 pneumonia COVID-19 Labs  Recent Labs    06/20/20 0539 06/21/20 0346 06/22/20 0429  DDIMER 0.90* 0.59* 0.52*  FERRITIN 1,373* 1,084* 814*  CRP 9.4* 4.9* 1.9*    Lab Results  Component Value Date    SARSCOV2NAA POSITIVE (A) 06/19/2020  . -Encouraged to prone. -, out of bed to chair. -, to use incentive spirometry and flutter valve. -Patient received Actemra on admission. -IV Solu-Medrol.  60 mg BID -Continue with IV Remdesivir. -Continue to trend inflammatory markers closely -Procalcitonin<0.10 DC antibiotics  Elevated D-dimer -VQ scan with no evidence of PE, will change full dose anticoagulation to prophylaxis dose  Acute kidney injury:  Recent Labs  Lab 06/19/20 0935 06/20/20 0539 06/21/20 0346 06/22/20 0429  CREATININE 1.59* 1.20 1.23 1.10  -Resolved  Transaminitis: Acute.  -Due to Covid, trending down.    DVT prophylaxis: Lovenox Code Status: Full Family Communication:  Status is: Inpatient    Dispo: The patient is from: Home              Anticipated d/c is to: Home              Anticipated d/c date is: Completion of Remdesivir              Patient currently unstable      Consultants:    Procedures/Significant Events:    I have personally reviewed and interpreted all radiology studies and my findings are as above.  VENTILATOR SETTINGS: HFNC 8/5 Flow; 15 L/min SPO2; 91%   Cultures   Antimicrobials: Anti-infectives (From admission, onward)   Start     Ordered Stop   06/21/20 1400  azithromycin (ZITHROMAX) tablet 500 mg  Status:  Discontinued        06/21/20 0945 06/22/20 1505   06/20/20 1000  remdesivir 100 mg in sodium chloride 0.9 % 100 mL IVPB     Discontinue    "  Followed by" Linked Group Details   06/19/20 0942 06/24/20 0959   06/19/20 1400  cefTRIAXone (ROCEPHIN) 2 g in sodium chloride 0.9 % 100 mL IVPB  Status:  Discontinued        06/19/20 1330 06/22/20 1505   06/19/20 1400  azithromycin (ZITHROMAX) 500 mg in sodium chloride 0.9 % 250 mL IVPB  Status:  Discontinued        06/19/20 1330 06/21/20 0945   06/19/20 1000  remdesivir 200 mg in sodium chloride 0.9% 250 mL IVPB       "Followed by" Linked Group Details   06/19/20 0942  06/19/20 1116       Devices    LINES / TUBES:      Continuous Infusions: . cefTRIAXone (ROCEPHIN)  IV Stopped (06/21/20 2252)  . remdesivir 100 mg in NS 100 mL 100 mg (06/22/20 1004)     Objective: Vitals:   06/21/20 2051 06/22/20 0508 06/22/20 0748 06/22/20 1152  BP: 140/72 138/86 (!) 111/92 136/74  Pulse: 73 70 75 73  Resp: 20 20 19 19   Temp: 98.1 F (36.7 C) 98.1 F (36.7 C) 98.4 F (36.9 C) 98.3 F (36.8 C)  TempSrc: Oral Oral Oral Oral  SpO2: 90% 94% 93% 91%  Weight:      Height:        Intake/Output Summary (Last 24 hours) at 06/22/2020 1154 Last data filed at 06/22/2020 1004 Gross per 24 hour  Intake 1050 ml  Output 3680 ml  Net -2630 ml   Filed Weights   06/19/20 0904 06/19/20 2356 06/21/20 0444  Weight: 90.7 kg 122.5 kg 120.1 kg   Physical Exam:  General: A/O x4, positive acute respiratory distress Eyes: negative scleral hemorrhage, negative anisocoria, negative icterus ENT: Negative Runny nose, negative gingival bleeding, Neck:  Negative scars, masses, torticollis, lymphadenopathy, JVD Lungs: decreased breath sounds bilaterally without wheezes or crackles Cardiovascular: Regular rate and rhythm without murmur gallop or rub normal S1 and S2 Abdomen: negative abdominal pain, nondistended, positive soft, bowel sounds, no rebound, no ascites, no appreciable mass Extremities: No significant cyanosis, clubbing, or edema bilateral lower extremities Skin: Negative rashes, lesions, ulcers Psychiatric:  Negative depression, negative anxiety, negative fatigue, negative mania  Central nervous system:  Cranial nerves II through XII intact, tongue/uvula midline, all extremities muscle strength 5/5, sensation intact throughout, negative dysarthria, negative expressive aphasia, negative receptive aphasia.  .     Data Reviewed: Care during the described time interval was provided by me .  I have reviewed this patient's available data, including medical history,  events of note, physical examination, and all test results as part of my evaluation.  CBC: Recent Labs  Lab 06/19/20 0935 06/19/20 1234 06/20/20 0539 06/21/20 0346 06/22/20 0429  WBC 4.8  --  3.1* 5.6 11.3*  NEUTROABS 2.7  --  1.6* 4.1 9.0*  HGB 15.8 15.3 14.7 15.0 15.1  HCT 47.9 45.0 45.7 44.5 45.5  MCV 88.2  --  87.4 86.2 86.8  PLT 240  --  257 343 357   Basic Metabolic Panel: Recent Labs  Lab 06/19/20 0935 06/19/20 1234 06/20/20 0539 06/21/20 0346 06/22/20 0429  NA 133* 135 135 138 138  K 3.7 3.5 3.9 3.9 3.8  CL 94*  --  97* 99 99  CO2 27  --  30 28 28   GLUCOSE 152*  --  153* 186* 169*  BUN 22*  --  21* 25* 22*  CREATININE 1.59*  --  1.20 1.23 1.10  CALCIUM 8.3*  --  8.4* 8.5* 8.5*  MG  --   --  2.6* 2.4 2.4  PHOS  --   --  2.8 2.9 3.0   GFR: Estimated Creatinine Clearance: 114.7 mL/min (by C-G formula based on SCr of 1.1 mg/dL). Liver Function Tests: Recent Labs  Lab 06/19/20 0935 06/20/20 0539 06/21/20 0346 06/22/20 0429  AST 123* 93* 59* 44*  ALT 97* 91* 79* 72*  ALKPHOS 62 57 50 46  BILITOT 1.0 1.1 0.9 0.9  PROT 7.0 6.6 6.3* 6.4*  ALBUMIN 3.2* 3.0* 3.0* 3.0*   No results for input(s): LIPASE, AMYLASE in the last 168 hours. No results for input(s): AMMONIA in the last 168 hours. Coagulation Profile: No results for input(s): INR, PROTIME in the last 168 hours. Cardiac Enzymes: No results for input(s): CKTOTAL, CKMB, CKMBINDEX, TROPONINI in the last 168 hours. BNP (last 3 results) No results for input(s): PROBNP in the last 8760 hours. HbA1C: No results for input(s): HGBA1C in the last 72 hours. CBG: Recent Labs  Lab 06/19/20 0857 06/20/20 2057  GLUCAP 153* 187*   Lipid Profile: No results for input(s): CHOL, HDL, LDLCALC, TRIG, CHOLHDL, LDLDIRECT in the last 72 hours. Thyroid Function Tests: No results for input(s): TSH, T4TOTAL, FREET4, T3FREE, THYROIDAB in the last 72 hours. Anemia Panel: Recent Labs    06/21/20 0346 06/22/20 0429    FERRITIN 1,084* 814*   Sepsis Labs: Recent Labs  Lab 06/19/20 0935 06/21/20 0346 06/22/20 0429  PROCALCITON 0.45 0.13 <0.10  LATICACIDVEN 1.9  --   --     Recent Results (from the past 240 hour(s))  Blood Culture (routine x 2)     Status: None (Preliminary result)   Collection Time: 06/19/20  9:00 AM   Specimen: BLOOD  Result Value Ref Range Status   Specimen Description BLOOD LEFT ANTECUBITAL  Final   Special Requests   Final    BOTTLES DRAWN AEROBIC AND ANAEROBIC Blood Culture adequate volume   Culture  Setup Time   Final    GRAM POSITIVE COCCI IN CLUSTERS GRAM POSITIVE RODS IN BOTH AEROBIC AND ANAEROBIC BOTTLES CRITICAL RESULT CALLED TO, READ BACK BY AND VERIFIED WITH: A. Artis Flock PharmD 8:45 06/20/20 ([email or web address]) Performed at [facility], [address] [place], [place] [address]    Culture GRAM POSITIVE COCCI GRAM POSITIVE RODS   Final   Report Status PENDING  Incomplete  Blood Culture ID Panel (Reflexed)     Status: Abnormal   Collection Time: 06/19/20  9:00 AM  Result Value Ref Range Status   Enterococcus faecalis NOT DETECTED NOT DETECTED Final   Enterococcus Faecium NOT DETECTED NOT DETECTED Final   Listeria monocytogenes NOT DETECTED NOT DETECTED Final   Staphylococcus species DETECTED (A) NOT DETECTED Final    Comment: CRITICAL RESULT CALLED TO, READ BACK BY AND VERIFIED WITH: A. Artis Flock PharmD 8:45 06/20/20 ([email or web address])    Staphylococcus aureus (BCID) NOT DETECTED NOT DETECTED Final   Staphylococcus epidermidis DETECTED (A) NOT DETECTED Final   Staphylococcus lugdunensis NOT DETECTED NOT DETECTED Final   Streptococcus species NOT DETECTED NOT DETECTED Final   Streptococcus agalactiae NOT DETECTED NOT DETECTED Final   Streptococcus pneumoniae NOT DETECTED NOT DETECTED Final   Streptococcus pyogenes NOT DETECTED NOT DETECTED Final   A.calcoaceticus-baumannii NOT DETECTED NOT DETECTED Final   Bacteroides fragilis NOT DETECTED NOT DETECTED Final   Enterobacterales  NOT DETECTED NOT DETECTED Final   Enterobacter cloacae complex NOT DETECTED NOT DETECTED Final   Escherichia coli NOT DETECTED NOT DETECTED Final  Klebsiella aerogenes NOT DETECTED NOT DETECTED Final   Klebsiella oxytoca NOT DETECTED NOT DETECTED Final   Klebsiella pneumoniae NOT DETECTED NOT DETECTED Final   Proteus species NOT DETECTED NOT DETECTED Final   Salmonella species NOT DETECTED NOT DETECTED Final   Serratia marcescens NOT DETECTED NOT DETECTED Final   Haemophilus influenzae NOT DETECTED NOT DETECTED Final   Neisseria meningitidis NOT DETECTED NOT DETECTED Final   Pseudomonas aeruginosa NOT DETECTED NOT DETECTED Final   Stenotrophomonas maltophilia NOT DETECTED NOT DETECTED Final   Candida albicans NOT DETECTED NOT DETECTED Final   Candida auris NOT DETECTED NOT DETECTED Final   Candida glabrata NOT DETECTED NOT DETECTED Final   Candida krusei NOT DETECTED NOT DETECTED Final   Candida parapsilosis NOT DETECTED NOT DETECTED Final   Candida tropicalis NOT DETECTED NOT DETECTED Final   Cryptococcus neoformans/gattii NOT DETECTED NOT DETECTED Final   Methicillin resistance mecA/C NOT DETECTED NOT DETECTED Final    Comment: Performed at [facility], [address], [place], [place] [address]  Blood Culture (routine x 2)     Status: None (Preliminary result)   Collection Time: 06/19/20  9:08 AM   Specimen: BLOOD  Result Value Ref Range Status   Specimen Description BLOOD RIGHT ANTECUBITAL  Final   Special Requests   Final    BOTTLES DRAWN AEROBIC AND ANAEROBIC Blood Culture adequate volume   Culture   Final    NO GROWTH 2 DAYS Performed at [facility], [address], [place], [place] [address]    Report Status PENDING  Incomplete  SARS Coronavirus 2 by RT PCR (hospital order, performed in [facility] hospital lab) Nasopharyngeal Nasopharyngeal Swab     Status: Abnormal   Collection Time: 06/19/20 10:38 AM   Specimen: Nasopharyngeal Swab  Result Value Ref  Range Status   SARS Coronavirus 2 POSITIVE (A) NEGATIVE Final    Comment: RESULT CALLED TO, READ BACK BY AND VERIFIED WITH: Drema Halon RN 14:20 06/19/20 ([email or web address]) (NOTE) SARS-CoV-2 target nucleic acids are DETECTED  SARS-CoV-2 RNA is generally detectable in upper respiratory specimens  during the acute phase of infection.  Positive results are indicative  of the presence of the identified virus, but do not rule out bacterial infection or co-infection with other pathogens not detected by the test.  Clinical correlation with patient history and  other diagnostic information is necessary to determine patient infection status.  The expected result is negative.  Fact Sheet for Patients:   BoilerBrush.com.cy   Fact Sheet for Healthcare Providers:   https://pope.com/    This test is not yet approved or cleared by the [place] FDA and  has been authorized for detection and/or diagnosis of SARS-CoV-2 by FDA under an Emergency Use Authorization (EUA).  This EUA will remain in effect (meaning this t est can be used) for the duration of  the COVID-19 declaration under Section 564(b)(1) of the Act, 21 U.S.C. section 360-bbb-3(b)(1), unless the authorization is terminated or revoked sooner.  Performed at [facility], [address], [place], [place] [address]   Expectorated sputum assessment w rflx to resp cult     Status: None   Collection Time: 06/21/20  9:16 AM   Specimen: Expectorated Sputum  Result Value Ref Range Status   Specimen Description Expect. Sput  Final   Special Requests Normal  Final   Sputum evaluation   Final    THIS SPECIMEN IS ACCEPTABLE FOR SPUTUM CULTURE Performed at [facility]  Lab, [address] [place], [place] [address]    Report Status 06/21/2020 FINAL  Final  Culture, respiratory     Status: None (Preliminary result)   Collection Time: 06/21/20  9:16 AM  Result Value Ref Range Status   Specimen  Description Expect. Sput  Final   Special Requests Normal Reflexed from [id number]  Final   Gram Stain   Final    RARE WBC PRESENT, PREDOMINANTLY PMN FEW GRAM POSITIVE COCCI IN PAIRS IN CHAINS RARE GRAM NEGATIVE RODS RARE GRAM POSITIVE RODS    Culture   Final    CULTURE REINCUBATED FOR BETTER GROWTH Performed at [facility], [address] [place], [place] [address]    Report Status PENDING  Incomplete         Radiology Studies: No results found.      Scheduled Meds: . albuterol  2 puff Inhalation Q6H  . vitamin C  500 mg Oral Daily  . azithromycin  500 mg Oral Q24H  . enoxaparin (LOVENOX) injection  60 mg Subcutaneous Q24H  . methylPREDNISolone (SOLU-MEDROL) injection  60 mg Intravenous Q12H  . pantoprazole  40 mg Oral Daily  . sodium chloride flush  3 mL Intravenous Q12H  . zinc sulfate  220 mg Oral Daily   Continuous Infusions: . cefTRIAXone (ROCEPHIN)  IV Stopped (06/21/20 2252)  . remdesivir 100 mg in NS 100 mL 100 mg (06/22/20 1004)     LOS: 3 days    Time spent:40 min    Anfernee Peschke, Roselind Messier, MD [facility] Pager [phone]  If 7PM-7AM, please contact night-coverage [email or web address] Password [email or web address] 06/22/2020, 11:54 AM

## 2020-06-22 NOTE — Plan of Care (Signed)
  Problem: Clinical Measurements: Goal: Ability to maintain clinical measurements within normal limits will improve Outcome: Progressing Goal: Will remain free from infection Outcome: Progressing Goal: Diagnostic test results will improve Outcome: Progressing Goal: Respiratory complications will improve Outcome: Progressing   Problem: Safety: Goal: Ability to remain free from injury will improve Outcome: Progressing   Problem: Respiratory: Goal: Will maintain a patent airway Outcome: Progressing

## 2020-06-23 LAB — CBC WITH DIFFERENTIAL/PLATELET
Abs Immature Granulocytes: 0.63 10*3/uL — ABNORMAL HIGH (ref 0.00–0.07)
Basophils Absolute: 0.1 10*3/uL (ref 0.0–0.1)
Basophils Relative: 0 %
Eosinophils Absolute: 0 10*3/uL (ref 0.0–0.5)
Eosinophils Relative: 0 %
HCT: 45.4 % (ref 39.0–52.0)
Hemoglobin: 14.8 g/dL (ref 13.0–17.0)
Immature Granulocytes: 4 %
Lymphocytes Relative: 10 %
Lymphs Abs: 1.5 10*3/uL (ref 0.7–4.0)
MCH: 28.5 pg (ref 26.0–34.0)
MCHC: 32.6 g/dL (ref 30.0–36.0)
MCV: 87.3 fL (ref 80.0–100.0)
Monocytes Absolute: 1.2 10*3/uL — ABNORMAL HIGH (ref 0.1–1.0)
Monocytes Relative: 8 %
Neutro Abs: 12.2 10*3/uL — ABNORMAL HIGH (ref 1.7–7.7)
Neutrophils Relative %: 78 %
Platelets: 402 10*3/uL — ABNORMAL HIGH (ref 150–400)
RBC: 5.2 MIL/uL (ref 4.22–5.81)
RDW: 12.3 % (ref 11.5–15.5)
WBC: 15.6 10*3/uL — ABNORMAL HIGH (ref 4.0–10.5)
nRBC: 0 % (ref 0.0–0.2)

## 2020-06-23 LAB — CULTURE, RESPIRATORY W GRAM STAIN
Culture: NORMAL
Special Requests: NORMAL

## 2020-06-23 LAB — CULTURE, BLOOD (ROUTINE X 2): Special Requests: ADEQUATE

## 2020-06-23 LAB — PROCALCITONIN: Procalcitonin: <0.1 ng/mL

## 2020-06-23 LAB — C-REACTIVE PROTEIN: CRP: 0.8 mg/dL (ref <1.0)

## 2020-06-23 LAB — COMPREHENSIVE METABOLIC PANEL
ALT: 69 U/L — ABNORMAL HIGH (ref 0–44)
AST: 41 U/L (ref 15–41)
Albumin: 2.9 g/dL — ABNORMAL LOW (ref 3.5–5.0)
Alkaline Phosphatase: 53 U/L (ref 38–126)
Anion gap: 9 (ref 5–15)
BUN: 21 mg/dL — ABNORMAL HIGH (ref 6–20)
CO2: 29 mmol/L (ref 22–32)
Calcium: 8.3 mg/dL — ABNORMAL LOW (ref 8.9–10.3)
Chloride: 99 mmol/L (ref 98–111)
Creatinine, Ser: 1.27 mg/dL — ABNORMAL HIGH (ref 0.61–1.24)
GFR calc Af Amer: >60 mL/min (ref >60)
GFR calc non Af Amer: >60 mL/min (ref >60)
Glucose, Bld: 137 mg/dL — ABNORMAL HIGH (ref 70–99)
Potassium: 4.5 mmol/L (ref 3.5–5.1)
Sodium: 137 mmol/L (ref 135–145)
Total Bilirubin: 0.8 mg/dL (ref 0.3–1.2)
Total Protein: 5.8 g/dL — ABNORMAL LOW (ref 6.5–8.1)

## 2020-06-23 LAB — MAGNESIUM: Magnesium: 2.3 mg/dL (ref 1.7–2.4)

## 2020-06-23 LAB — FERRITIN: Ferritin: 737 ng/mL — ABNORMAL HIGH (ref 24–336)

## 2020-06-23 LAB — PHOSPHORUS: Phosphorus: 3.1 mg/dL (ref 2.5–4.6)

## 2020-06-23 LAB — D-DIMER, QUANTITATIVE: D-Dimer, Quant: 0.85 ug/mL-FEU — ABNORMAL HIGH (ref 0.00–0.50)

## 2020-06-23 MED ORDER — ALBUTEROL SULFATE HFA 108 (90 BASE) MCG/ACT IN AERS
2.0000 | INHALATION_SPRAY | Freq: Three times a day (TID) | RESPIRATORY_TRACT | Status: DC
  Administered 2020-06-23 – 2020-06-26 (×8): 2 via RESPIRATORY_TRACT
  Filled 2020-06-23 (×2): qty 6.7

## 2020-06-23 NOTE — Progress Notes (Signed)
PROGRESS NOTE    Kenneth Hudson  UUV:[id number] DOB: [birth year] DOA: 06/19/2020 PCP: Patient, No Pcp Per     Brief Narrative:  44 y.o.BM PMHx no significant past medical history   diagnosed with COVID-19 7/28 presents withprogressively worseningshortness of breath. Symptoms initially started 12 days ago and tested positive for COVID-19 virus 5 days ago. Complains of associated symptoms of subjective fevers, chest pain, generalized malaise, intermittently productive cough, insomnia, and poor appetite. Denies having any nausea, vomiting, diarrhea, calf pain symptoms. He denies having any medical problems, but does not regularly see a primary care physician. Notes that he never got around to get the COVID-19 vaccine. He notes that none of his family lives nearbyand he willupdate them. - Upon admission into the emergency department patient was seen to have a temperature of 100.3 F, pulse 95-1 04, respirations 28-37, and O2 saturations as low as 35% and currently lower 90s on nonrebreather and he did high flow nasal cannula oxygen. Labs noted CBC within normal limits, sodium 133, BUN 22, creatinine 1.59, glucose 152, AST 123, ALT 97, lactic acid 1.9, LDH 588, D-dimer 1.13,andfibrinogen 571. Patient was started on remdesivir and given Decadron 6 mg IV.    Subjective: 8/6 afebrile overnight A/O x4, positive S OB, negative CP, negative abdominal pain.  Negative nausea.  Negative vomiting.   Assessment & Plan: Covid vaccination; negative vaccination   Principal Problem:   Acute respiratory failure with hypoxia (HCC) Active Problems:   Pneumonia due to COVID-19 virus   AKI (acute kidney injury) (HCC)   Elevated d-dimer   Transaminitis  Acute respiratory failure with hypoxia/Covid 19 pneumonia COVID-19 Labs  Recent Labs    06/21/20 0346 06/22/20 0429 06/23/20 0432  DDIMER 0.59* 0.52* 0.85*  FERRITIN 1,084* 814* 737*  CRP 4.9* 1.9* 0.8    Lab Results  Component Value Date    SARSCOV2NAA POSITIVE (A) 06/19/2020  . -Encouraged to prone. -, out of bed to chair. -, to use incentive spirometry and flutter valve. -Patient received Actemra on admission. -IV Solu-Medrol.  60 mg BID -Continue with IV Remdesivir. -Continue to trend inflammatory markers closely -Procalcitonin<0.10 DC antibiotics -8/6 patient is a happy hypoxic however still on significant amount of O2.  Patient does well in the SPO2 low 80s, can eat, will talk and joke with you.  In the a.m. will DC O2 and see how low patient SPO2 actually drops  Elevated D-dimer -VQ scan with no evidence of PE, will change full dose anticoagulation to prophylaxis dose  Acute kidney injury:  Recent Labs  Lab 06/19/20 0935 06/20/20 0539 06/21/20 0346 06/22/20 0429 06/23/20 0432  CREATININE 1.59* 1.20 1.23 1.10 1.27*  -Resolved  Transaminitis: Acute.  -Due to Covid, trending down.    DVT prophylaxis: Lovenox Code Status: Full Family Communication:  Status is: Inpatient    Dispo: The patient is from: Home              Anticipated d/c is to: Home              Anticipated d/c date is: Completion of Remdesivir              Patient currently unstable      Consultants:    Procedures/Significant Events:    I have personally reviewed and interpreted all radiology studies and my findings are as above.  VENTILATOR SETTINGS: HFNC 8/6 Flow; 15 L/min SPO2; 72%   Cultures   Antimicrobials: Anti-infectives (From admission, onward)   Start  Ordered Stop   06/21/20 1400  azithromycin (ZITHROMAX) tablet 500 mg  Status:  Discontinued        06/21/20 0945 06/22/20 1505   06/20/20 1000  remdesivir 100 mg in sodium chloride 0.9 % 100 mL IVPB     Discontinue    "Followed by" Linked Group Details   06/19/20 0942 06/24/20 0959   06/19/20 1400  cefTRIAXone (ROCEPHIN) 2 g in sodium chloride 0.9 % 100 mL IVPB  Status:  Discontinued        06/19/20 1330 06/22/20 1505   06/19/20 1400  azithromycin  (ZITHROMAX) 500 mg in sodium chloride 0.9 % 250 mL IVPB  Status:  Discontinued        06/19/20 1330 06/21/20 0945   06/19/20 1000  remdesivir 200 mg in sodium chloride 0.9% 250 mL IVPB       "Followed by" Linked Group Details   06/19/20 0942 06/19/20 1116       Devices    LINES / TUBES:      Continuous Infusions:    Objective: Vitals:   06/22/20 2010 06/23/20 0009 06/23/20 0409 06/23/20 0754  BP: 135/78 126/79 134/79 140/90  Pulse: 79 76 63 73  Resp: 15 20 19 18   Temp: 98 F (36.7 C) 98.6 F (37 C) 98.4 F (36.9 C) 98 F (36.7 C)  TempSrc: Oral Oral Oral Oral  SpO2: 90% 91% (!) 87% 90%  Weight:   120 kg   Height:        Intake/Output Summary (Last 24 hours) at 06/23/2020 1106 Last data filed at 06/23/2020 08/23/2020 Gross per 24 hour  Intake 740 ml  Output 700 ml  Net 40 ml   Filed Weights   06/19/20 2356 06/21/20 0444 06/23/20 0409  Weight: 122.5 kg 120.1 kg 120 kg   Physical Exam:  General: A/O x4, positive acute respiratory distress Eyes: negative scleral hemorrhage, negative anisocoria, negative icterus ENT: Negative Runny nose, negative gingival bleeding, Neck:  Negative scars, masses, torticollis, lymphadenopathy, JVD Lungs: decreased breath sounds bilaterally without wheezes or crackles Cardiovascular: Regular rate and rhythm without murmur gallop or rub normal S1 and S2 Abdomen: negative abdominal pain, nondistended, positive soft, bowel sounds, no rebound, no ascites, no appreciable mass Extremities: No significant cyanosis, clubbing, or edema bilateral lower extremities Skin: Negative rashes, lesions, ulcers Psychiatric:  Negative depression, negative anxiety, negative fatigue, negative mania  Central nervous system:  Cranial nerves II through XII intact, tongue/uvula midline, all extremities muscle strength 5/5, sensation intact throughout, negative dysarthria, negative expressive aphasia, negative receptive aphasia.  .     Data Reviewed: Care  during the described time interval was provided by me .  I have reviewed this patient's available data, including medical history, events of note, physical examination, and all test results as part of my evaluation.  CBC: Recent Labs  Lab 06/19/20 0935 06/19/20 0935 06/19/20 1234 06/20/20 0539 06/21/20 0346 06/22/20 0429 06/23/20 0432  WBC 4.8  --   --  3.1* 5.6 11.3* 15.6*  NEUTROABS 2.7  --   --  1.6* 4.1 9.0* 12.2*  HGB 15.8   < > 15.3 14.7 15.0 15.1 14.8  HCT 47.9   < > 45.0 45.7 44.5 45.5 45.4  MCV 88.2  --   --  87.4 86.2 86.8 87.3  PLT 240  --   --  257 343 357 402*   < > = values in this interval not displayed.   Basic Metabolic Panel: Recent Labs  Lab 06/19/20 0935  06/19/20 0935 06/19/20 1234 06/20/20 0539 06/21/20 0346 06/22/20 0429 06/23/20 0432  NA 133*   < > 135 135 138 138 137  K 3.7   < > 3.5 3.9 3.9 3.8 4.5  CL 94*  --   --  97* 99 99 99  CO2 27  --   --  30 28 28 29   GLUCOSE 152*  --   --  153* 186* 169* 137*  BUN 22*  --   --  21* 25* 22* 21*  CREATININE 1.59*  --   --  1.20 1.23 1.10 1.27*  CALCIUM 8.3*  --   --  8.4* 8.5* 8.5* 8.3*  MG  --   --   --  2.6* 2.4 2.4 2.3  PHOS  --   --   --  2.8 2.9 3.0 3.1   < > = values in this interval not displayed.   GFR: Estimated Creatinine Clearance: 99.3 mL/min (A) (by C-G formula based on SCr of 1.27 mg/dL (H)). Liver Function Tests: Recent Labs  Lab 06/19/20 0935 06/20/20 0539 06/21/20 0346 06/22/20 0429 06/23/20 0432  AST 123* 93* 59* 44* 41  ALT 97* 91* 79* 72* 69*  ALKPHOS 62 57 50 46 53  BILITOT 1.0 1.1 0.9 0.9 0.8  PROT 7.0 6.6 6.3* 6.4* 5.8*  ALBUMIN 3.2* 3.0* 3.0* 3.0* 2.9*   No results for input(s): LIPASE, AMYLASE in the last 168 hours. No results for input(s): AMMONIA in the last 168 hours. Coagulation Profile: No results for input(s): INR, PROTIME in the last 168 hours. Cardiac Enzymes: No results for input(s): CKTOTAL, CKMB, CKMBINDEX, TROPONINI in the last 168 hours. BNP (last 3  results) No results for input(s): PROBNP in the last 8760 hours. HbA1C: No results for input(s): HGBA1C in the last 72 hours. CBG: Recent Labs  Lab 06/19/20 0857 06/20/20 2057  GLUCAP 153* 187*   Lipid Profile: No results for input(s): CHOL, HDL, LDLCALC, TRIG, CHOLHDL, LDLDIRECT in the last 72 hours. Thyroid Function Tests: No results for input(s): TSH, T4TOTAL, FREET4, T3FREE, THYROIDAB in the last 72 hours. Anemia Panel: Recent Labs    06/22/20 0429 06/23/20 0432  FERRITIN 814* 737*   Sepsis Labs: Recent Labs  Lab 06/19/20 0935 06/21/20 0346 06/22/20 0429 06/23/20 0432  PROCALCITON 0.45 0.13 <0.10 <0.10  LATICACIDVEN 1.9  --   --   --     Recent Results (from the past 240 hour(s))  Blood Culture (routine x 2)     Status: Abnormal   Collection Time: 06/19/20  9:00 AM   Specimen: BLOOD  Result Value Ref Range Status   Specimen Description BLOOD LEFT ANTECUBITAL  Final   Special Requests   Final    BOTTLES DRAWN AEROBIC AND ANAEROBIC Blood Culture adequate volume   Culture  Setup Time   Final    GRAM POSITIVE COCCI IN CLUSTERS GRAM POSITIVE RODS IN BOTH AEROBIC AND ANAEROBIC BOTTLES CRITICAL RESULT CALLED TO, READ BACK BY AND VERIFIED WITH: A. 08/19/20 PharmD 8:45 06/20/20 ([email or web address])    Culture (A)  Final    DIPHTHEROIDS(CORYNEBACTERIUM SPECIES) STAPHYLOCOCCUS HAEMOLYTICUS STAPHYLOCOCCUS EPIDERMIDIS THE SIGNIFICANCE OF ISOLATING THIS ORGANISM FROM A SINGLE SET OF BLOOD CULTURES WHEN MULTIPLE SETS ARE DRAWN IS UNCERTAIN. PLEASE NOTIFY THE MICROBIOLOGY DEPARTMENT WITHIN ONE WEEK IF SPECIATION AND SENSITIVITIES ARE REQUIRED. Performed at [facility], [address], [place], [place]    Report Status 06/23/2020 FINAL  Final  Blood Culture ID Panel (Reflexed)  Status: Abnormal   Collection Time: 06/19/20  9:00 AM  Result Value Ref Range Status   Enterococcus faecalis NOT DETECTED NOT DETECTED Final   Enterococcus Faecium NOT DETECTED NOT DETECTED  Final   Listeria monocytogenes NOT DETECTED NOT DETECTED Final   Staphylococcus species DETECTED (A) NOT DETECTED Final    Comment: CRITICAL RESULT CALLED TO, READ BACK BY AND VERIFIED WITH: A. Artis FlockWolfe PharmD 8:45 06/20/20 ([email or web address])    Staphylococcus aureus (BCID) NOT DETECTED NOT DETECTED Final   Staphylococcus epidermidis DETECTED (A) NOT DETECTED Final   Staphylococcus lugdunensis NOT DETECTED NOT DETECTED Final   Streptococcus species NOT DETECTED NOT DETECTED Final   Streptococcus agalactiae NOT DETECTED NOT DETECTED Final   Streptococcus pneumoniae NOT DETECTED NOT DETECTED Final   Streptococcus pyogenes NOT DETECTED NOT DETECTED Final   A.calcoaceticus-baumannii NOT DETECTED NOT DETECTED Final   Bacteroides fragilis NOT DETECTED NOT DETECTED Final   Enterobacterales NOT DETECTED NOT DETECTED Final   Enterobacter cloacae complex NOT DETECTED NOT DETECTED Final   Escherichia coli NOT DETECTED NOT DETECTED Final   Klebsiella aerogenes NOT DETECTED NOT DETECTED Final   Klebsiella oxytoca NOT DETECTED NOT DETECTED Final   Klebsiella pneumoniae NOT DETECTED NOT DETECTED Final   Proteus species NOT DETECTED NOT DETECTED Final   Salmonella species NOT DETECTED NOT DETECTED Final   Serratia marcescens NOT DETECTED NOT DETECTED Final   Haemophilus influenzae NOT DETECTED NOT DETECTED Final   Neisseria meningitidis NOT DETECTED NOT DETECTED Final   Pseudomonas aeruginosa NOT DETECTED NOT DETECTED Final   Stenotrophomonas maltophilia NOT DETECTED NOT DETECTED Final   Candida albicans NOT DETECTED NOT DETECTED Final   Candida auris NOT DETECTED NOT DETECTED Final   Candida glabrata NOT DETECTED NOT DETECTED Final   Candida krusei NOT DETECTED NOT DETECTED Final   Candida parapsilosis NOT DETECTED NOT DETECTED Final   Candida tropicalis NOT DETECTED NOT DETECTED Final   Cryptococcus neoformans/gattii NOT DETECTED NOT DETECTED Final   Methicillin resistance mecA/C NOT DETECTED NOT DETECTED  Final    Comment: Performed at [facility], [address] [place], [place] [address]  Blood Culture (routine x 2)     Status: None (Preliminary result)   Collection Time: 06/19/20  9:08 AM   Specimen: BLOOD  Result Value Ref Range Status   Specimen Description BLOOD RIGHT ANTECUBITAL  Final   Special Requests   Final    BOTTLES DRAWN AEROBIC AND ANAEROBIC Blood Culture adequate volume   Culture   Final    NO GROWTH 3 DAYS Performed at [facility], [address] [place], [place] [address]    Report Status PENDING  Incomplete  SARS Coronavirus 2 by RT PCR (hospital order, performed in [facility] hospital lab) Nasopharyngeal Nasopharyngeal Swab     Status: Abnormal   Collection Time: 06/19/20 10:38 AM   Specimen: Nasopharyngeal Swab  Result Value Ref Range Status   SARS Coronavirus 2 POSITIVE (A) NEGATIVE Final    Comment: RESULT CALLED TO, READ BACK BY AND VERIFIED WITH: Drema HalonK. Fields RN 14:20 06/19/20 ([email or web address]) (NOTE) SARS-CoV-2 target nucleic acids are DETECTED  SARS-CoV-2 RNA is generally detectable in upper respiratory specimens  during the acute phase of infection.  Positive results are indicative  of the presence of the identified virus, but do not rule out bacterial infection or co-infection with other pathogens not detected by the test.  Clinical correlation with patient history and  other diagnostic information is necessary to determine patient infection status.  The expected result is negative.  Fact Sheet for Patients:   BoilerBrush.com.cy   Fact Sheet for Healthcare Providers:   https://pope.com/    This test is not yet approved or cleared by the [place] FDA and  has been authorized for detection and/or diagnosis of SARS-CoV-2 by FDA under an Emergency Use Authorization (EUA).  This EUA will remain in effect (meaning this t est can be used) for the duration of  the COVID-19 declaration under Section  564(b)(1) of the Act, 21 U.S.C. section 360-bbb-3(b)(1), unless the authorization is terminated or revoked sooner.  Performed at [facility], [address] [place], [place] [address]   Expectorated sputum assessment w rflx to resp cult     Status: None   Collection Time: 06/21/20  9:16 AM   Specimen: Expectorated Sputum  Result Value Ref Range Status   Specimen Description Expect. Sput  Final   Special Requests Normal  Final   Sputum evaluation   Final    THIS SPECIMEN IS ACCEPTABLE FOR SPUTUM CULTURE Performed at [facility], [address], [place], [place] [address]    Report Status 06/21/2020 FINAL  Final  Culture, respiratory     Status: None   Collection Time: 06/21/20  9:16 AM  Result Value Ref Range Status   Specimen Description Expect. Sput  Final   Special Requests Normal Reflexed from [id number]  Final   Gram Stain   Final    RARE WBC PRESENT, PREDOMINANTLY PMN FEW GRAM POSITIVE COCCI IN PAIRS IN CHAINS RARE GRAM NEGATIVE RODS RARE GRAM POSITIVE RODS    Culture   Final    MODERATE Normal respiratory flora-no Staph aureus or Pseudomonas seen Performed at [facility], [address], [place], [place] [address]    Report Status 06/23/2020 FINAL  Final         Radiology Studies: No results found.      Scheduled Meds: . albuterol  2 puff Inhalation Q6H  . vitamin C  500 mg Oral Daily  . enoxaparin (LOVENOX) injection  60 mg Subcutaneous Q24H  . methylPREDNISolone (SOLU-MEDROL) injection  60 mg Intravenous Q12H  . pantoprazole  40 mg Oral Daily  . sodium chloride flush  3 mL Intravenous Q12H  . zinc sulfate  220 mg Oral Daily   Continuous Infusions:    LOS: 4 days    Time spent:40 min    Sherre Wooton, Roselind Messier, MD [facility] Pager [phone]  If 7PM-7AM, please contact night-coverage [email or web address] Password [email or web address] 06/23/2020, 11:06 AM

## 2020-06-24 LAB — CBC WITH DIFFERENTIAL/PLATELET
Abs Immature Granulocytes: 1.05 10*3/uL — ABNORMAL HIGH (ref 0.00–0.07)
Basophils Absolute: 0.1 10*3/uL (ref 0.0–0.1)
Basophils Relative: 1 %
Eosinophils Absolute: 0 10*3/uL (ref 0.0–0.5)
Eosinophils Relative: 0 %
HCT: 49.9 % (ref 39.0–52.0)
Hemoglobin: 16.4 g/dL (ref 13.0–17.0)
Immature Granulocytes: 5 %
Lymphocytes Relative: 8 %
Lymphs Abs: 1.5 10*3/uL (ref 0.7–4.0)
MCH: 28.2 pg (ref 26.0–34.0)
MCHC: 32.9 g/dL (ref 30.0–36.0)
MCV: 85.9 fL (ref 80.0–100.0)
Monocytes Absolute: 1.3 10*3/uL — ABNORMAL HIGH (ref 0.1–1.0)
Monocytes Relative: 7 %
Neutro Abs: 15.4 10*3/uL — ABNORMAL HIGH (ref 1.7–7.7)
Neutrophils Relative %: 79 %
Platelets: 420 10*3/uL — ABNORMAL HIGH (ref 150–400)
RBC: 5.81 MIL/uL (ref 4.22–5.81)
RDW: 12.2 % (ref 11.5–15.5)
WBC: 19.4 10*3/uL — ABNORMAL HIGH (ref 4.0–10.5)
nRBC: 0 % (ref 0.0–0.2)

## 2020-06-24 LAB — BLOOD CULTURE ID PANEL (REFLEXED) - BCID2

## 2020-06-24 LAB — CULTURE, BLOOD (ROUTINE X 2)
Culture: NO GROWTH
Special Requests: ADEQUATE

## 2020-06-24 LAB — COMPREHENSIVE METABOLIC PANEL
ALT: 74 U/L — ABNORMAL HIGH (ref 0–44)
AST: 34 U/L (ref 15–41)
Albumin: 3.2 g/dL — ABNORMAL LOW (ref 3.5–5.0)
Alkaline Phosphatase: 52 U/L (ref 38–126)
Anion gap: 10 (ref 5–15)
BUN: 20 mg/dL (ref 6–20)
CO2: 23 mmol/L (ref 22–32)
Calcium: 8.6 mg/dL — ABNORMAL LOW (ref 8.9–10.3)
Chloride: 102 mmol/L (ref 98–111)
Creatinine, Ser: 1.03 mg/dL (ref 0.61–1.24)
GFR calc Af Amer: >60 mL/min (ref >60)
GFR calc non Af Amer: >60 mL/min (ref >60)
Glucose, Bld: 137 mg/dL — ABNORMAL HIGH (ref 70–99)
Potassium: 4.1 mmol/L (ref 3.5–5.1)
Sodium: 135 mmol/L (ref 135–145)
Total Bilirubin: 1 mg/dL (ref 0.3–1.2)
Total Protein: 6.3 g/dL — ABNORMAL LOW (ref 6.5–8.1)

## 2020-06-24 LAB — C-REACTIVE PROTEIN: CRP: 0.6 mg/dL (ref <1.0)

## 2020-06-24 LAB — FERRITIN: Ferritin: 643 ng/mL — ABNORMAL HIGH (ref 24–336)

## 2020-06-24 LAB — D-DIMER, QUANTITATIVE: D-Dimer, Quant: 0.71 ug/mL-FEU — ABNORMAL HIGH (ref 0.00–0.50)

## 2020-06-24 LAB — MAGNESIUM: Magnesium: 2.2 mg/dL (ref 1.7–2.4)

## 2020-06-24 LAB — PHOSPHORUS: Phosphorus: 2.9 mg/dL (ref 2.5–4.6)

## 2020-06-24 MED ORDER — WHITE PETROLATUM EX OINT
TOPICAL_OINTMENT | CUTANEOUS | Status: AC
  Filled 2020-06-24: qty 28.35

## 2020-06-24 NOTE — Progress Notes (Signed)
PROGRESS NOTE    Kenneth Hudson  ZOX:[id number] DOB: 1976/01/14 DOA: 06/19/2020 PCP: Patient, No Pcp Per     Brief Narrative:  44 y.o.BM PMHx no significant past medical history   diagnosed with COVID-19 7/28 presents withprogressively worseningshortness of breath. Symptoms initially started 12 days ago and tested positive for COVID-19 virus 5 days ago. Complains of associated symptoms of subjective fevers, chest pain, generalized malaise, intermittently productive cough, insomnia, and poor appetite. Denies having any nausea, vomiting, diarrhea, calf pain symptoms. He denies having any medical problems, but does not regularly see a primary care physician. Notes that he never got around to get the COVID-19 vaccine. He notes that none of his family lives nearbyand he willupdate them. - Upon admission into the emergency department patient was seen to have a temperature of 100.3 F, pulse 95-1 04, respirations 28-37, and O2 saturations as low as 35% and currently lower 90s on nonrebreather and he did high flow nasal cannula oxygen. Labs noted CBC within normal limits, sodium 133, BUN 22, creatinine 1.59, glucose 152, AST 123, ALT 97, lactic acid 1.9, LDH 588, D-dimer 1.13,andfibrinogen 571. Patient was started on remdesivir and given Decadron 6 mg IV.    Subjective: 8/7 febrile overnight A/O x4, positive S OB, negative CP.  Negative abdominal pain.  States ambulated around room to perform his ADLs became tachypneic and positive DOE.  At rest patient is a happy hypoxic.    Assessment & Plan: Covid vaccination; negative vaccination   Principal Problem:   Acute respiratory failure with hypoxia (HCC) Active Problems:   Pneumonia due to COVID-19 virus   AKI (acute kidney injury) (HCC)   Elevated d-dimer   Transaminitis  Acute respiratory failure with hypoxia/Covid 19 pneumonia COVID-19 Labs  Recent Labs    06/22/20 0429 06/23/20 0432 06/24/20 0721  DDIMER 0.52* 0.85* 0.71*    FERRITIN 814* 737* 643*  CRP 1.9* 0.8 0.6    Lab Results  Component Value Date   SARSCOV2NAA POSITIVE (A) 06/19/2020  . -Encouraged to prone. -, out of bed to chair. -, to use incentive spirometry and flutter valve. -Patient received Actemra on admission. -IV Solu-Medrol.  60 mg BID -Continue with IV Remdesivir. -Continue to trend inflammatory markers closely -Procalcitonin<0.10 DC antibiotics -8/6 patient is a happy hypoxic however still on significant amount of O2.  Patient does well in the SPO2 low 80s, can eat, will talk and joke with you.  In the a.m. will DC O2 and see how low patient SPO2 actually drops -8/7 warned patient that he is a happy hypoxic and that he should not ambulate around the room without assistance as he has a very narrow window between functioning and when he may pass out. -8/7 ambulatory SPO2 pending  Elevated D-dimer -VQ scan with no evidence of PE, will change full dose anticoagulation to prophylaxis dose  Acute kidney injury:  Recent Labs  Lab 06/20/20 0539 06/21/20 0346 06/22/20 0429 06/23/20 0432 06/24/20 0721  CREATININE 1.20 1.23 1.10 1.27* 1.03  -Resolved  Transaminitis: Acute.  -Due to Covid, trending down.    DVT prophylaxis: Lovenox Code Status: Full Family Communication:  Status is: Inpatient    Dispo: The patient is from: Home              Anticipated d/c is to: Home              Anticipated d/c date is: Completion of Remdesivir  Patient currently unstable      Consultants:    Procedures/Significant Events:    I have personally reviewed and interpreted all radiology studies and my findings are as above.  VENTILATOR SETTINGS: HFNC 8/7 Flow; 13 L/min SPO2; 90%   Cultures   Antimicrobials: Anti-infectives (From admission, onward)   Start     Ordered Stop   06/21/20 1400  azithromycin (ZITHROMAX) tablet 500 mg  Status:  Discontinued        06/21/20 0945 06/22/20 1505   06/20/20 1000   remdesivir 100 mg in sodium chloride 0.9 % 100 mL IVPB     Discontinue    "Followed by" Linked Group Details   06/19/20 0942 06/24/20 0959   06/19/20 1400  cefTRIAXone (ROCEPHIN) 2 g in sodium chloride 0.9 % 100 mL IVPB  Status:  Discontinued        06/19/20 1330 06/22/20 1505   06/19/20 1400  azithromycin (ZITHROMAX) 500 mg in sodium chloride 0.9 % 250 mL IVPB  Status:  Discontinued        06/19/20 1330 06/21/20 0945   06/19/20 1000  remdesivir 200 mg in sodium chloride 0.9% 250 mL IVPB       "Followed by" Linked Group Details   06/19/20 0942 06/19/20 1116       Devices    LINES / TUBES:      Continuous Infusions:    Objective: Vitals:   06/23/20 2306 06/24/20 0301 06/24/20 0744 06/24/20 1148  BP: 132/83 (!) 132/95 (!) 154/90 135/89  Pulse: 86 77 73 88  Resp: (!) 25 20 20  (!) 21  Temp: 97.9 F (36.6 C) 98.1 F (36.7 C) 98.4 F (36.9 C) 97.9 F (36.6 C)  TempSrc: Oral Oral Oral Oral  SpO2: 94% 92% 90% 93%  Weight:      Height:        Intake/Output Summary (Last 24 hours) at 06/24/2020 1302 Last data filed at 06/24/2020 [id number] Gross per 24 hour  Intake 480 ml  Output 1325 ml  Net -845 ml   Filed Weights   06/19/20 2356 06/21/20 0444 06/23/20 0409  Weight: 122.5 kg 120.1 kg 120 kg   Physical Exam:  General: A/O x4, positive acute respiratory distress Eyes: negative scleral hemorrhage, negative anisocoria, negative icterus ENT: Negative Runny nose, negative gingival bleeding, Neck:  Negative scars, masses, torticollis, lymphadenopathy, JVD Lungs: decreased breath sounds bilaterally without wheezes or crackles Cardiovascular: Regular rate and rhythm without murmur gallop or rub normal S1 and S2 Abdomen: negative abdominal pain, nondistended, positive soft, bowel sounds, no rebound, no ascites, no appreciable mass Extremities: No significant cyanosis, clubbing, or edema bilateral lower extremities Skin: Negative rashes, lesions, ulcers Psychiatric:  Negative  depression, negative anxiety, negative fatigue, negative mania  Central nervous system:  Cranial nerves II through XII intact, tongue/uvula midline, all extremities muscle strength 5/5, sensation intact throughout, negative dysarthria, negative expressive aphasia, negative receptive aphasia.  .     Data Reviewed: Care during the described time interval was provided by me .  I have reviewed this patient's available data, including medical history, events of note, physical examination, and all test results as part of my evaluation.  CBC: Recent Labs  Lab 06/20/20 0539 06/21/20 0346 06/22/20 0429 06/23/20 0432 06/24/20 0721  WBC 3.1* 5.6 11.3* 15.6* 19.4*  NEUTROABS 1.6* 4.1 9.0* 12.2* 15.4*  HGB 14.7 15.0 15.1 14.8 16.4  HCT 45.7 44.5 45.5 45.4 49.9  MCV 87.4 86.2 86.8 87.3 85.9  PLT 257 343 357 402*  420*   Basic Metabolic Panel: Recent Labs  Lab 06/20/20 0539 06/21/20 0346 06/22/20 0429 06/23/20 0432 06/24/20 0721  NA 135 138 138 137 135  K 3.9 3.9 3.8 4.5 4.1  CL 97* 99 99 99 102  CO2 30 28 28 29 23   GLUCOSE 153* 186* 169* 137* 137*  BUN 21* 25* 22* 21* 20  CREATININE 1.20 1.23 1.10 1.27* 1.03  CALCIUM 8.4* 8.5* 8.5* 8.3* 8.6*  MG 2.6* 2.4 2.4 2.3 2.2  PHOS 2.8 2.9 3.0 3.1 2.9   GFR: Estimated Creatinine Clearance: 122.5 mL/min (by C-G formula based on SCr of 1.03 mg/dL). Liver Function Tests: Recent Labs  Lab 06/20/20 0539 06/21/20 0346 06/22/20 0429 06/23/20 0432 06/24/20 0721  AST 93* 59* 44* 41 34  ALT 91* 79* 72* 69* 74*  ALKPHOS 57 50 46 53 52  BILITOT 1.1 0.9 0.9 0.8 1.0  PROT 6.6 6.3* 6.4* 5.8* 6.3*  ALBUMIN 3.0* 3.0* 3.0* 2.9* 3.2*   No results for input(s): LIPASE, AMYLASE in the last 168 hours. No results for input(s): AMMONIA in the last 168 hours. Coagulation Profile: No results for input(s): INR, PROTIME in the last 168 hours. Cardiac Enzymes: No results for input(s): CKTOTAL, CKMB, CKMBINDEX, TROPONINI in the last 168 hours. BNP (last 3  results) No results for input(s): PROBNP in the last 8760 hours. HbA1C: No results for input(s): HGBA1C in the last 72 hours. CBG: Recent Labs  Lab 06/19/20 0857 06/20/20 2057  GLUCAP 153* 187*   Lipid Profile: No results for input(s): CHOL, HDL, LDLCALC, TRIG, CHOLHDL, LDLDIRECT in the last 72 hours. Thyroid Function Tests: No results for input(s): TSH, T4TOTAL, FREET4, T3FREE, THYROIDAB in the last 72 hours. Anemia Panel: Recent Labs    06/23/20 0432 06/24/20 0721  FERRITIN 737* 643*   Sepsis Labs: Recent Labs  Lab 06/19/20 0935 06/21/20 0346 06/22/20 0429 06/23/20 0432  PROCALCITON 0.45 0.13 <0.10 <0.10  LATICACIDVEN 1.9  --   --   --     Recent Results (from the past 240 hour(s))  Blood Culture (routine x 2)     Status: Abnormal   Collection Time: 06/19/20  9:00 AM   Specimen: BLOOD  Result Value Ref Range Status   Specimen Description BLOOD LEFT ANTECUBITAL  Final   Special Requests   Final    BOTTLES DRAWN AEROBIC AND ANAEROBIC Blood Culture adequate volume   Culture  Setup Time   Final    GRAM POSITIVE COCCI IN CLUSTERS GRAM POSITIVE RODS IN BOTH AEROBIC AND ANAEROBIC BOTTLES CRITICAL RESULT CALLED TO, READ BACK BY AND VERIFIED WITH: A. 08/19/20 PharmD 8:45 06/20/20 ([email or web address])    Culture (A)  Final    DIPHTHEROIDS(CORYNEBACTERIUM SPECIES) STAPHYLOCOCCUS HAEMOLYTICUS STAPHYLOCOCCUS EPIDERMIDIS THE SIGNIFICANCE OF ISOLATING THIS ORGANISM FROM A SINGLE SET OF BLOOD CULTURES WHEN MULTIPLE SETS ARE DRAWN IS UNCERTAIN. PLEASE NOTIFY THE MICROBIOLOGY DEPARTMENT WITHIN ONE WEEK IF SPECIATION AND SENSITIVITIES ARE REQUIRED. Performed at [facility], [address], [place], [place]    Report Status 06/23/2020 FINAL  Final  Blood Culture ID Panel (Reflexed)     Status: Abnormal   Collection Time: 06/19/20  9:00 AM  Result Value Ref Range Status   Enterococcus faecalis NOT DETECTED NOT DETECTED Final   Enterococcus Faecium NOT DETECTED NOT DETECTED  Final   Listeria monocytogenes NOT DETECTED NOT DETECTED Final   Staphylococcus species DETECTED (A) NOT DETECTED Final    Comment: CRITICAL RESULT CALLED TO, READ BACK BY AND VERIFIED WITH:  Joretta Bachelor PharmD 8:45 06/20/20 (wilsonm)    Staphylococcus aureus (BCID) NOT DETECTED NOT DETECTED Final   Staphylococcus epidermidis DETECTED (A) NOT DETECTED Final   Staphylococcus lugdunensis NOT DETECTED NOT DETECTED Final   Streptococcus species NOT DETECTED NOT DETECTED Final   Streptococcus agalactiae NOT DETECTED NOT DETECTED Final   Streptococcus pneumoniae NOT DETECTED NOT DETECTED Final   Streptococcus pyogenes NOT DETECTED NOT DETECTED Final   A.calcoaceticus-baumannii NOT DETECTED NOT DETECTED Final   Bacteroides fragilis NOT DETECTED NOT DETECTED Final   Enterobacterales NOT DETECTED NOT DETECTED Final   Enterobacter cloacae complex NOT DETECTED NOT DETECTED Final   Escherichia coli NOT DETECTED NOT DETECTED Final   Klebsiella aerogenes NOT DETECTED NOT DETECTED Final   Klebsiella oxytoca NOT DETECTED NOT DETECTED Final   Klebsiella pneumoniae NOT DETECTED NOT DETECTED Final   Proteus species NOT DETECTED NOT DETECTED Final   Salmonella species NOT DETECTED NOT DETECTED Final   Serratia marcescens NOT DETECTED NOT DETECTED Final   Haemophilus influenzae NOT DETECTED NOT DETECTED Final   Neisseria meningitidis NOT DETECTED NOT DETECTED Final   Pseudomonas aeruginosa NOT DETECTED NOT DETECTED Final   Stenotrophomonas maltophilia NOT DETECTED NOT DETECTED Final   Candida albicans NOT DETECTED NOT DETECTED Final   Candida auris NOT DETECTED NOT DETECTED Final   Candida glabrata NOT DETECTED NOT DETECTED Final   Candida krusei NOT DETECTED NOT DETECTED Final   Candida parapsilosis NOT DETECTED NOT DETECTED Final   Candida tropicalis NOT DETECTED NOT DETECTED Final   Cryptococcus neoformans/gattii NOT DETECTED NOT DETECTED Final   Methicillin resistance mecA/C NOT DETECTED NOT DETECTED  Final    Comment: Performed at [facility], [address], [place], [place] [address]  Blood Culture (routine x 2)     Status: None   Collection Time: 06/19/20  9:08 AM   Specimen: BLOOD  Result Value Ref Range Status   Specimen Description BLOOD RIGHT ANTECUBITAL  Final   Special Requests   Final    BOTTLES DRAWN AEROBIC AND ANAEROBIC Blood Culture adequate volume   Culture   Final    NO GROWTH 5 DAYS Performed at [facility], [address], [place], [place] [address]    Report Status 06/24/2020 FINAL  Final  SARS Coronavirus 2 by RT PCR (hospital order, performed in [facility] hospital lab) Nasopharyngeal Nasopharyngeal Swab     Status: Abnormal   Collection Time: 06/19/20 10:38 AM   Specimen: Nasopharyngeal Swab  Result Value Ref Range Status   SARS Coronavirus 2 POSITIVE (A) NEGATIVE Final    Comment: RESULT CALLED TO, READ BACK BY AND VERIFIED WITH: Drema Halon RN 14:20 06/19/20 (wilsonm) (NOTE) SARS-CoV-2 target nucleic acids are DETECTED  SARS-CoV-2 RNA is generally detectable in upper respiratory specimens  during the acute phase of infection.  Positive results are indicative  of the presence of the identified virus, but do not rule out bacterial infection or co-infection with other pathogens not detected by the test.  Clinical correlation with patient history and  other diagnostic information is necessary to determine patient infection status.  The expected result is negative.  Fact Sheet for Patients:   BoilerBrush.com.cy   Fact Sheet for Healthcare Providers:   https://pope.com/    This test is not yet approved or cleared by the [place] FDA and  has been authorized for detection and/or diagnosis of SARS-CoV-2 by FDA under an Emergency Use Authorization (EUA).  This EUA will remain in effect (meaning this t  est can be used) for the duration of  the COVID-19 declaration under Section 564(b)(1) of the  Act, 21 U.S.C. section 360-bbb-3(b)(1), unless the authorization is terminated or revoked sooner.  Performed at [facility], [address], [place], [place] [address]   Expectorated sputum assessment w rflx to resp cult     Status: None   Collection Time: 06/21/20  9:16 AM   Specimen: Expectorated Sputum  Result Value Ref Range Status   Specimen Description Expect. Sput  Final   Special Requests Normal  Final   Sputum evaluation   Final    THIS SPECIMEN IS ACCEPTABLE FOR SPUTUM CULTURE Performed at [facility], [address] [place], [place] [address]    Report Status 06/21/2020 FINAL  Final  Culture, respiratory     Status: None   Collection Time: 06/21/20  9:16 AM  Result Value Ref Range Status   Specimen Description Expect. Sput  Final   Special Requests Normal Reflexed from [id number]  Final   Gram Stain   Final    RARE WBC PRESENT, PREDOMINANTLY PMN FEW GRAM POSITIVE COCCI IN PAIRS IN CHAINS RARE GRAM NEGATIVE RODS RARE GRAM POSITIVE RODS    Culture   Final    MODERATE Normal respiratory flora-no Staph aureus or Pseudomonas seen Performed at [facility], [address] [place], [place] [address]    Report Status 06/23/2020 FINAL  Final         Radiology Studies: No results found.      Scheduled Meds: . albuterol  2 puff Inhalation TID  . vitamin C  500 mg Oral Daily  . enoxaparin (LOVENOX) injection  60 mg Subcutaneous Q24H  . methylPREDNISolone (SOLU-MEDROL) injection  60 mg Intravenous Q12H  . pantoprazole  40 mg Oral Daily  . sodium chloride flush  3 mL Intravenous Q12H  . zinc sulfate  220 mg Oral Daily   Continuous Infusions:    LOS: 5 days    Time spent:40 min    Dat Derksen, Roselind Messier, MD [facility] Pager [phone]  If 7PM-7AM, please contact night-coverage [email or web address] Password [email or web address] 06/24/2020, 1:02 PM

## 2020-06-25 LAB — GLUCOSE, CAPILLARY: Glucose-Capillary: 197 mg/dL — ABNORMAL HIGH (ref 70–99)

## 2020-06-25 LAB — CBC WITH DIFFERENTIAL/PLATELET
Abs Immature Granulocytes: 0.4 10*3/uL — ABNORMAL HIGH (ref 0.00–0.07)
Basophils Absolute: 0.2 10*3/uL — ABNORMAL HIGH (ref 0.0–0.1)
Basophils Relative: 1 %
Eosinophils Absolute: 0.2 10*3/uL (ref 0.0–0.5)
Eosinophils Relative: 1 %
HCT: 48.9 % (ref 39.0–52.0)
Hemoglobin: 16.1 g/dL (ref 13.0–17.0)
Lymphocytes Relative: 4 %
Lymphs Abs: 0.8 10*3/uL (ref 0.7–4.0)
MCH: 28.4 pg (ref 26.0–34.0)
MCHC: 32.9 g/dL (ref 30.0–36.0)
MCV: 86.2 fL (ref 80.0–100.0)
Metamyelocytes Relative: 1 %
Monocytes Absolute: 1.1 10*3/uL — ABNORMAL HIGH (ref 0.1–1.0)
Monocytes Relative: 6 %
Myelocytes: 1 %
Neutro Abs: 16.3 10*3/uL — ABNORMAL HIGH (ref 1.7–7.7)
Neutrophils Relative %: 86 %
Platelets: 434 10*3/uL — ABNORMAL HIGH (ref 150–400)
RBC: 5.67 MIL/uL (ref 4.22–5.81)
RDW: 12.4 % (ref 11.5–15.5)
WBC: 18.9 10*3/uL — ABNORMAL HIGH (ref 4.0–10.5)
nRBC: 0 % (ref 0.0–0.2)
nRBC: 0 /100 WBC

## 2020-06-25 LAB — FERRITIN: Ferritin: 732 ng/mL — ABNORMAL HIGH (ref 24–336)

## 2020-06-25 LAB — COMPREHENSIVE METABOLIC PANEL
ALT: 74 U/L — ABNORMAL HIGH (ref 0–44)
AST: 35 U/L (ref 15–41)
Albumin: 3 g/dL — ABNORMAL LOW (ref 3.5–5.0)
Alkaline Phosphatase: 51 U/L (ref 38–126)
Anion gap: 12 (ref 5–15)
BUN: 21 mg/dL — ABNORMAL HIGH (ref 6–20)
CO2: 23 mmol/L (ref 22–32)
Calcium: 8.4 mg/dL — ABNORMAL LOW (ref 8.9–10.3)
Chloride: 101 mmol/L (ref 98–111)
Creatinine, Ser: 1.15 mg/dL (ref 0.61–1.24)
GFR calc Af Amer: >60 mL/min (ref >60)
GFR calc non Af Amer: >60 mL/min (ref >60)
Glucose, Bld: 148 mg/dL — ABNORMAL HIGH (ref 70–99)
Potassium: 4.3 mmol/L (ref 3.5–5.1)
Sodium: 136 mmol/L (ref 135–145)
Total Bilirubin: 0.8 mg/dL (ref 0.3–1.2)
Total Protein: 6 g/dL — ABNORMAL LOW (ref 6.5–8.1)

## 2020-06-25 LAB — LACTATE DEHYDROGENASE: LDH: 353 U/L — ABNORMAL HIGH (ref 98–192)

## 2020-06-25 LAB — D-DIMER, QUANTITATIVE: D-Dimer, Quant: 0.7 ug/mL-FEU — ABNORMAL HIGH (ref 0.00–0.50)

## 2020-06-25 LAB — MAGNESIUM: Magnesium: 2.3 mg/dL (ref 1.7–2.4)

## 2020-06-25 LAB — C-REACTIVE PROTEIN: CRP: <0.5 mg/dL (ref <1.0)

## 2020-06-25 LAB — PHOSPHORUS: Phosphorus: 2.8 mg/dL (ref 2.5–4.6)

## 2020-06-25 NOTE — Progress Notes (Signed)
Patient transitioned from 5L HFNC to 4L [place]. Ambulated patient from bed to bathroom without any oxygen to assess oxygen demand. Patient quickly desated to 74%. 4L [place] replaced on patient with improvement of saturations to 86%. At rest, patients spO2 is 89-91%. Will continue to monitor.

## 2020-06-25 NOTE — Progress Notes (Signed)
SATURATION QUALIFICATIONS: (This note is used to comply with regulatory documentation for home oxygen)  Patient Saturations on Room Air at Rest = 81%  Patient Saturations on Room Air while Ambulating = 74%  Patient Saturations on 4 Liters of oxygen while Ambulating = 86%  Please briefly explain why patient needs home oxygen: Patient had desaturation to 74% while walking from bed to the sink without any O2.

## 2020-06-25 NOTE — Progress Notes (Signed)
Orders placed for ambulatory O2 saturations. Patient currently on 12L HFNC and sating 88-91%. Will ambulate patient when oxygen is titrated further and patient can tolerate. Dr. Jamille Art made aware of situation.

## 2020-06-25 NOTE — Progress Notes (Signed)
PROGRESS NOTE    Kenneth Hudson  IWO:[id number] DOB: 12-16-1975 DOA: 06/19/2020 PCP: Patient, No Pcp Per     Brief Narrative:  44 y.o.BM PMHx no significant past medical history   diagnosed with COVID-19 7/28 presents withprogressively worseningshortness of breath. Symptoms initially started 12 days ago and tested positive for COVID-19 virus 5 days ago. Complains of associated symptoms of subjective fevers, chest pain, generalized malaise, intermittently productive cough, insomnia, and poor appetite. Denies having any nausea, vomiting, diarrhea, calf pain symptoms. He denies having any medical problems, but does not regularly see a primary care physician. Notes that he never got around to get the COVID-19 vaccine. He notes that none of his family lives nearbyand he willupdate them. - Upon admission into the emergency department patient was seen to have a temperature of 100.3 F, pulse 95-1 04, respirations 28-37, and O2 saturations as low as 35% and currently lower 90s on nonrebreather and he did high flow nasal cannula oxygen. Labs noted CBC within normal limits, sodium 133, BUN 22, creatinine 1.59, glucose 152, AST 123, ALT 97, lactic acid 1.9, LDH 588, D-dimer 1.13,andfibrinogen 571. Patient was started on remdesivir and given Decadron 6 mg IV.    Subjective: 8/8 afebrile overnight A/O x4, positive DOE, negative CP, negative abdominal pain, negative nausea, negative vomiting.    Assessment & Plan: Covid vaccination; negative vaccination   Principal Problem:   Acute respiratory failure with hypoxia (HCC) Active Problems:   Pneumonia due to COVID-19 virus   AKI (acute kidney injury) (HCC)   Elevated d-dimer   Transaminitis  Acute respiratory failure with hypoxia/Covid 19 pneumonia COVID-19 Labs  Recent Labs    06/23/20 0432 06/24/20 0721  DDIMER 0.85* 0.71*  FERRITIN 737* 643*  CRP 0.8 0.6    Lab Results  Component Value Date   SARSCOV2NAA POSITIVE (A)  06/19/2020  . -Encouraged to prone. -, out of bed to chair. -, to use incentive spirometry and flutter valve. -Patient received Actemra on admission. -IV Solu-Medrol.  60 mg BID -Completed course of IV Remdesivir. -Continue to trend inflammatory markers closely -Procalcitonin<0.10 DC antibiotics -8/6 patient is a happy hypoxic however still on significant amount of O2.  Patient does well in the SPO2 low 80s, can eat, will talk and joke with you.  In the a.m. will DC O2 and see how low patient SPO2 actually drops -8/7 warned patient that he is a happy hypoxic and that he should not ambulate around the room without assistance as he has a very narrow window between functioning and when he may pass out. -8/7 ambulatory SPO2 pending -[facility] at 9am. They will assist you with finding a PCP.  SATURATION QUALIFICATIONS: (This note is used to comply with regulatory documentation for home oxygen) Patient Saturations on Room Air at Rest = 81% Patient Saturations on Room Air while Ambulating = 74% Patient Saturations on 4 Liters of oxygen while Ambulating = 86% Please briefly explain why patient needs home oxygen: Patient had desaturation to 74% while walking from bed to the sink without any O2 -Patient meets criteria for home O2 -4 L of O2, titrate to maintain SPO2> 88% -Provide Inogen home O2 concentrator  Elevated D-dimer -VQ scan with no evidence of PE, will change full dose anticoagulation to prophylaxis dose  Acute kidney injury:  Recent Labs  Lab 06/20/20 0539 06/21/20 0346 06/22/20 0429 06/23/20 0432 06/24/20 0721  CREATININE 1.20 1.23 1.10 1.27*  1.03  -Resolved  Transaminitis: Acute.  -Due to Covid, trending down.    DVT prophylaxis: Lovenox Code Status: Full Family Communication:  Status is: Inpatient    Dispo: The patient is from: Home              Anticipated d/c is to: Home               Anticipated d/c date is: 8/9              Patient currently unstable      Consultants:    Procedures/Significant Events:    I have personally reviewed and interpreted all radiology studies and my findings are as above.  VENTILATOR SETTINGS: Nasal cannula 8/8 Flow; 4 L/min SPO2; 89%   Cultures   Antimicrobials: Anti-infectives (From admission, onward)   Start     Ordered Stop   06/21/20 1400  azithromycin (ZITHROMAX) tablet 500 mg  Status:  Discontinued        06/21/20 0945 06/22/20 1505   06/20/20 1000  remdesivir 100 mg in sodium chloride 0.9 % 100 mL IVPB     Discontinue    "Followed by" Linked Group Details   06/19/20 0942 06/24/20 0959   06/19/20 1400  cefTRIAXone (ROCEPHIN) 2 g in sodium chloride 0.9 % 100 mL IVPB  Status:  Discontinued        06/19/20 1330 06/22/20 1505   06/19/20 1400  azithromycin (ZITHROMAX) 500 mg in sodium chloride 0.9 % 250 mL IVPB  Status:  Discontinued        06/19/20 1330 06/21/20 0945   06/19/20 1000  remdesivir 200 mg in sodium chloride 0.9% 250 mL IVPB       "Followed by" Linked Group Details   06/19/20 0942 06/19/20 1116       Devices    LINES / TUBES:      Continuous Infusions:    Objective: Vitals:   06/24/20 2036 06/25/20 0115 06/25/20 0418 06/25/20 0829  BP: 125/90 130/83 125/85 (!) 129/93  Pulse: 87  62 88  Resp: (!) 21 (!) 22 (!) 23 19  Temp: 98.2 F (36.8 C) 98.1 F (36.7 C) 98.1 F (36.7 C) 97.9 F (36.6 C)  TempSrc: Oral Oral Oral Oral  SpO2: 93%  (!) 87% (!) 89%  Weight:      Height:        Intake/Output Summary (Last 24 hours) at 06/25/2020 0901 Last data filed at 06/25/2020 0851 Gross per 24 hour  Intake 840 ml  Output 1050 ml  Net -210 ml   Filed Weights   06/19/20 2356 06/21/20 0444 06/23/20 0409  Weight: 122.5 kg 120.1 kg 120 kg   Physical Exam:  General: A/O x4, positive acute respiratory distress Eyes: negative scleral hemorrhage, negative anisocoria, negative icterus ENT: Negative  Runny nose, negative gingival bleeding, Neck:  Negative scars, masses, torticollis, lymphadenopathy, JVD Lungs: decreased breath sounds bilaterally without wheezes or crackles Cardiovascular: Regular rate and rhythm without murmur gallop or rub normal S1 and S2 Abdomen: negative abdominal pain, nondistended, positive soft, bowel sounds, no rebound, no ascites, no appreciable mass Extremities: No significant cyanosis, clubbing, or edema bilateral lower extremities Skin: Negative rashes, lesions, ulcers Psychiatric:  Negative depression, negative anxiety, negative fatigue, negative mania  Central nervous system:  Cranial nerves II through XII intact, tongue/uvula midline, all extremities muscle strength 5/5, sensation intact throughout, negative dysarthria, negative expressive aphasia, negative receptive aphasia.  .     Data Reviewed: Care during the described time  interval was provided by me .  I have reviewed this patient's available data, including medical history, events of note, physical examination, and all test results as part of my evaluation.  CBC: Recent Labs  Lab 06/20/20 0539 06/21/20 0346 06/22/20 0429 06/23/20 0432 06/24/20 0721  WBC 3.1* 5.6 11.3* 15.6* 19.4*  NEUTROABS 1.6* 4.1 9.0* 12.2* 15.4*  HGB 14.7 15.0 15.1 14.8 16.4  HCT 45.7 44.5 45.5 45.4 49.9  MCV 87.4 86.2 86.8 87.3 85.9  PLT 257 343 357 402* 420*   Basic Metabolic Panel: Recent Labs  Lab 06/20/20 0539 06/21/20 0346 06/22/20 0429 06/23/20 0432 06/24/20 0721  NA 135 138 138 137 135  K 3.9 3.9 3.8 4.5 4.1  CL 97* 99 99 99 102  CO2 30 28 28 29 23   GLUCOSE 153* 186* 169* 137* 137*  BUN 21* 25* 22* 21* 20  CREATININE 1.20 1.23 1.10 1.27* 1.03  CALCIUM 8.4* 8.5* 8.5* 8.3* 8.6*  MG 2.6* 2.4 2.4 2.3 2.2  PHOS 2.8 2.9 3.0 3.1 2.9   GFR: Estimated Creatinine Clearance: 122.5 mL/min (by C-G formula based on SCr of 1.03 mg/dL). Liver Function Tests: Recent Labs  Lab 06/20/20 0539 06/21/20 0346  06/22/20 0429 06/23/20 0432 06/24/20 0721  AST 93* 59* 44* 41 34  ALT 91* 79* 72* 69* 74*  ALKPHOS 57 50 46 53 52  BILITOT 1.1 0.9 0.9 0.8 1.0  PROT 6.6 6.3* 6.4* 5.8* 6.3*  ALBUMIN 3.0* 3.0* 3.0* 2.9* 3.2*   No results for input(s): LIPASE, AMYLASE in the last 168 hours. No results for input(s): AMMONIA in the last 168 hours. Coagulation Profile: No results for input(s): INR, PROTIME in the last 168 hours. Cardiac Enzymes: No results for input(s): CKTOTAL, CKMB, CKMBINDEX, TROPONINI in the last 168 hours. BNP (last 3 results) No results for input(s): PROBNP in the last 8760 hours. HbA1C: No results for input(s): HGBA1C in the last 72 hours. CBG: Recent Labs  Lab 06/19/20 0857 06/20/20 2057  GLUCAP 153* 187*   Lipid Profile: No results for input(s): CHOL, HDL, LDLCALC, TRIG, CHOLHDL, LDLDIRECT in the last 72 hours. Thyroid Function Tests: No results for input(s): TSH, T4TOTAL, FREET4, T3FREE, THYROIDAB in the last 72 hours. Anemia Panel: Recent Labs    06/23/20 0432 06/24/20 0721  FERRITIN 737* 643*   Sepsis Labs: Recent Labs  Lab 06/19/20 0935 06/21/20 0346 06/22/20 0429 06/23/20 0432  PROCALCITON 0.45 0.13 <0.10 <0.10  LATICACIDVEN 1.9  --   --   --     Recent Results (from the past 240 hour(s))  Blood Culture (routine x 2)     Status: Abnormal   Collection Time: 06/19/20  9:00 AM   Specimen: BLOOD  Result Value Ref Range Status   Specimen Description BLOOD LEFT ANTECUBITAL  Final   Special Requests   Final    BOTTLES DRAWN AEROBIC AND ANAEROBIC Blood Culture adequate volume   Culture  Setup Time   Final    GRAM POSITIVE COCCI IN CLUSTERS GRAM POSITIVE RODS IN BOTH AEROBIC AND ANAEROBIC BOTTLES CRITICAL RESULT CALLED TO, READ BACK BY AND VERIFIED WITH: A. 08/19/20 PharmD 8:45 06/20/20 ([email or web address])    Culture (A)  Final    DIPHTHEROIDS(CORYNEBACTERIUM SPECIES) STAPHYLOCOCCUS HAEMOLYTICUS STAPHYLOCOCCUS EPIDERMIDIS THE SIGNIFICANCE OF ISOLATING THIS  ORGANISM FROM A SINGLE SET OF BLOOD CULTURES WHEN MULTIPLE SETS ARE DRAWN IS UNCERTAIN. PLEASE NOTIFY THE MICROBIOLOGY DEPARTMENT WITHIN ONE WEEK IF SPECIATION AND SENSITIVITIES ARE REQUIRED. Performed at [facility], [address]  [place], [place] [address]    Report Status 06/23/2020 FINAL  Final  Blood Culture ID Panel (Reflexed)     Status: Abnormal   Collection Time: 06/19/20  9:00 AM  Result Value Ref Range Status   Enterococcus faecalis NOT DETECTED NOT DETECTED Final   Enterococcus Faecium NOT DETECTED NOT DETECTED Final   Listeria monocytogenes NOT DETECTED NOT DETECTED Final   Staphylococcus species DETECTED (A) NOT DETECTED Final    Comment: CRITICAL RESULT CALLED TO, READ BACK BY AND VERIFIED WITH: A. Artis FlockWolfe PharmD 8:45 06/20/20 (wilsonm)    Staphylococcus aureus (BCID) NOT DETECTED NOT DETECTED Final   Staphylococcus epidermidis DETECTED (A) NOT DETECTED Final    Comment: CRITICAL RESULT CALLED TO, READ BACK BY AND VERIFIED WITH: A. Artis FlockWolfe PharmD 8:45 06/20/20 (wilsonm)    Staphylococcus lugdunensis NOT DETECTED NOT DETECTED Final   Streptococcus species NOT DETECTED NOT DETECTED Final   Streptococcus agalactiae NOT DETECTED NOT DETECTED Final   Streptococcus pneumoniae NOT DETECTED NOT DETECTED Final   Streptococcus pyogenes NOT DETECTED NOT DETECTED Final   A.calcoaceticus-baumannii NOT DETECTED NOT DETECTED Final   Bacteroides fragilis NOT DETECTED NOT DETECTED Final   Enterobacterales NOT DETECTED NOT DETECTED Final   Enterobacter cloacae complex NOT DETECTED NOT DETECTED Final   Escherichia coli NOT DETECTED NOT DETECTED Final   Klebsiella aerogenes NOT DETECTED NOT DETECTED Final   Klebsiella oxytoca NOT DETECTED NOT DETECTED Final   Klebsiella pneumoniae NOT DETECTED NOT DETECTED Final   Proteus species NOT DETECTED NOT DETECTED Final   Salmonella species NOT DETECTED NOT DETECTED Final   Serratia marcescens NOT DETECTED NOT DETECTED Final   Haemophilus  influenzae NOT DETECTED NOT DETECTED Final   Neisseria meningitidis NOT DETECTED NOT DETECTED Final   Pseudomonas aeruginosa NOT DETECTED NOT DETECTED Final   Stenotrophomonas maltophilia NOT DETECTED NOT DETECTED Final   Candida albicans NOT DETECTED NOT DETECTED Final   Candida auris NOT DETECTED NOT DETECTED Final   Candida glabrata NOT DETECTED NOT DETECTED Final   Candida krusei NOT DETECTED NOT DETECTED Final   Candida parapsilosis NOT DETECTED NOT DETECTED Final   Candida tropicalis NOT DETECTED NOT DETECTED Final   Cryptococcus neoformans/gattii NOT DETECTED NOT DETECTED Final   Methicillin resistance mecA/C NOT DETECTED NOT DETECTED Final    Comment: Performed at [facility], [address] [place], [place] [address]  Blood Culture (routine x 2)     Status: None   Collection Time: 06/19/20  9:08 AM   Specimen: BLOOD  Result Value Ref Range Status   Specimen Description BLOOD RIGHT ANTECUBITAL  Final   Special Requests   Final    BOTTLES DRAWN AEROBIC AND ANAEROBIC Blood Culture adequate volume   Culture   Final    NO GROWTH 5 DAYS Performed at [facility], [address] [place], [place] [address]    Report Status 06/24/2020 FINAL  Final  SARS Coronavirus 2 by RT PCR (hospital order, performed in [facility]) Nasopharyngeal Nasopharyngeal Swab     Status: Abnormal   Collection Time: 06/19/20 10:38 AM   Specimen: Nasopharyngeal Swab  Result Value Ref Range Status   SARS Coronavirus 2 POSITIVE (A) NEGATIVE Final    Comment: RESULT CALLED TO, READ BACK BY AND VERIFIED WITH: Drema HalonK. Fields RN 14:20 06/19/20 (wilsonm) (NOTE) SARS-CoV-2 target nucleic acids are DETECTED  SARS-CoV-2 RNA is generally detectable in upper respiratory specimens  during the acute phase of infection.  Positive results are indicative  of the  presence of the identified virus, but do not rule out bacterial infection or co-infection with other pathogens not detected by the test.   Clinical correlation with patient history and  other diagnostic information is necessary to determine patient infection status.  The expected result is negative.  Fact Sheet for Patients:   BoilerBrush.com.cy   Fact Sheet for Healthcare Providers:   https://pope.com/    This test is not yet approved or cleared by the [place] FDA and  has been authorized for detection and/or diagnosis of SARS-CoV-2 by FDA under an Emergency Use Authorization (EUA).  This EUA will remain in effect (meaning this t est can be used) for the duration of  the COVID-19 declaration under Section 564(b)(1) of the Act, 21 U.S.C. section 360-bbb-3(b)(1), unless the authorization is terminated or revoked sooner.  Performed at [facility], [address] [place], [place] [address]   Expectorated sputum assessment w rflx to resp cult     Status: None   Collection Time: 06/21/20  9:16 AM   Specimen: Expectorated Sputum  Result Value Ref Range Status   Specimen Description Expect. Sput  Final   Special Requests Normal  Final   Sputum evaluation   Final    THIS SPECIMEN IS ACCEPTABLE FOR SPUTUM CULTURE Performed at [facility], [address], [place], [place] [address]    Report Status 06/21/2020 FINAL  Final  Culture, respiratory     Status: None   Collection Time: 06/21/20  9:16 AM  Result Value Ref Range Status   Specimen Description Expect. Sput  Final   Special Requests Normal Reflexed from [id number]  Final   Gram Stain   Final    RARE WBC PRESENT, PREDOMINANTLY PMN FEW GRAM POSITIVE COCCI IN PAIRS IN CHAINS RARE GRAM NEGATIVE RODS RARE GRAM POSITIVE RODS    Culture   Final    MODERATE Normal respiratory flora-no Staph aureus or Pseudomonas seen Performed at [facility], [address] [place], [place] [address]    Report Status 06/23/2020 FINAL  Final         Radiology Studies: No results found.      Scheduled  Meds: . albuterol  2 puff Inhalation TID  . vitamin C  500 mg Oral Daily  . enoxaparin (LOVENOX) injection  60 mg Subcutaneous Q24H  . methylPREDNISolone (SOLU-MEDROL) injection  60 mg Intravenous Q12H  . pantoprazole  40 mg Oral Daily  . sodium chloride flush  3 mL Intravenous Q12H  . zinc sulfate  220 mg Oral Daily   Continuous Infusions:    LOS: 6 days    Time spent:40 min    Jaloni Davoli, Roselind Messier, MD [facility] Pager [phone]  If 7PM-7AM, please contact night-coverage [email or web address] Password [email or web address] 06/25/2020, 9:01 AM

## 2020-06-26 DIAGNOSIS — R7881 Bacteremia: Secondary | ICD-10-CM

## 2020-06-26 LAB — D-DIMER, QUANTITATIVE: D-Dimer, Quant: 0.44 ug/mL-FEU (ref 0.00–0.50)

## 2020-06-26 MED ORDER — ALBUTEROL SULFATE HFA 108 (90 BASE) MCG/ACT IN AERS: 2.0000 | INHALATION_SPRAY | Freq: Three times a day (TID) | RESPIRATORY_TRACT | 0 refills | Status: AC

## 2020-06-26 MED ORDER — ZINC SULFATE 220 (50 ZN) MG PO CAPS: 220.0000 mg | ORAL_CAPSULE | Freq: Every day | ORAL | 0 refills | Status: AC

## 2020-06-26 MED ORDER — PANTOPRAZOLE SODIUM 40 MG PO TBEC: 40.0000 mg | DELAYED_RELEASE_TABLET | Freq: Every day | ORAL | 0 refills | Status: AC

## 2020-06-26 MED ORDER — DEXAMETHASONE 0.5 MG PO TABS
0.5000 mg | ORAL_TABLET | Freq: Every day | ORAL | 0 refills | Status: AC
Start: 2020-06-26 — End: 2021-06-26

## 2020-06-26 MED ORDER — GUAIFENESIN-DM 100-10 MG/5ML PO SYRP: 10.0000 mL | ORAL_SOLUTION | ORAL | 0 refills | Status: AC | PRN

## 2020-06-26 MED ORDER — ASCORBIC ACID 500 MG PO TABS: 500.0000 mg | ORAL_TABLET | Freq: Every day | ORAL | 0 refills | Status: AC

## 2020-06-26 MED FILL — DEXAMETHASONE 0.5 MG TABLET: 0.5 | 3 days supply | Qty: 3 | Fill #0

## 2020-06-26 MED FILL — VITAMIN C 500 MG TABLET: 500 | 10 days supply | Qty: 10 | Fill #0

## 2020-06-26 MED FILL — PANTOPRAZOLE SOD DR 40 MG T: 40 | 10 days supply | Qty: 10 | Fill #0

## 2020-06-26 MED FILL — ZINC SULFATE 220 MG TABLET: 220 (50 ZN) | 10 days supply | Qty: 10 | Fill #0

## 2020-06-26 MED FILL — SM TUSSIN DM SYRUP: 100-10 | 4 days supply | Qty: 236 | Fill #0

## 2020-06-26 NOTE — Progress Notes (Signed)
Kenneth Hudson to be D/C'd Home per MD order.  Discussed with the patient and all questions fully answered.  VSS, Skin clean, dry and intact without evidence of skin break down, no evidence of skin tears noted. IV catheter discontinued intact. Site without signs and symptoms of complications. Dressing and pressure applied.  An After Visit Summary was printed and given to the patient. Patient received prescription.  D/c education completed with patient/family including follow up instructions, medication list, d/c activities limitations if indicated, with other d/c instructions as indicated by MD - patient able to verbalize understanding, all questions fully answered.   Patient instructed to return to ED, call 911, or call MD for any changes in condition.   Patient escorted via WC, and D/C home via private auto.  Oxygen deliver to the room prior to D/C home as well as medications from pharmacy.     Conley Canal [facility] 06/26/2020 4:48 PM

## 2020-06-26 NOTE — TOC Progression Note (Addendum)
Transition of Care [facility]) - Progression Note    Patient Details  Name: Render Marley MRN: [id number] Date of Birth: 27-Jun-1976  [facility] CM/SW Contact  Beckie Busing, RN Phone Number: [phone]  06/26/2020, 1:52 PM  Clinical Narrative:   Home oxygen has been set up through [employer]. The agency will deliver portable tank for transport home and will set up time to deliver a concentrator to the home.  1600 CM just received call from bedside RN to inquire about oxygen delivery. CM called [employer] who reports that they were having a difficult time getting in touch with patient. CM called nurse who is at the bedside with patient and states that room phone has been messes up all day and that patient may be reached on cell phone at [phone]. Phone number passed on to [employer].        Expected Discharge Plan and Services           Expected Discharge Date: 06/26/20               DME Arranged: Oxygen DME Agency: [employer] Date DME Agency Contacted: 06/26/20 Time DME Agency Contacted: [id number] Representative spoke with at DME Agency: Ian Malkin             Social Determinants of Health (SDOH) Interventions    Readmission Risk Interventions No flowsheet data found.

## 2020-06-26 NOTE — Plan of Care (Signed)
  Problem: Safety: Goal: Ability to remain free from injury will improve Outcome: Progressing   Problem: Respiratory: Goal: Will maintain a patent airway Outcome: Progressing   

## 2020-06-26 NOTE — Discharge Summary (Addendum)
Physician Discharge Summary  Melody Savidge ZOX:[id number] DOB: November 27, 1975 DOA: 06/19/2020  PCP: Patient, No Pcp Per  Admit date: 06/19/2020 Discharge date: 06/26/2020  Time spent: 30 minutes  Recommendations for Outpatient Follow-up: Covid vaccination; negative vaccination   Acute respiratory failure with hypoxia/Covid 19 pneumonia COVID-19 Labs  Recent Labs    06/24/20 0721 06/25/20 0843 06/25/20 1414 06/26/20 0242  DDIMER 0.71* 0.70*  --  0.44  FERRITIN 643* 732*  --   --   LDH  --   --  353*  --   CRP 0.6 <0.5  --   --     Lab Results  Component Value Date   SARSCOV2NAA POSITIVE (A) 06/19/2020    -Encouraged to prone. -, out of bed to chair. -, to use incentive spirometry and flutter valve. -Patient received Actemraon admission. -IV Solu-Medrol.  60 mg BID -Completed course of IV Remdesivir. -Continue to trend inflammatory markers closely -Procalcitonin<0.10 DC antibiotics -8/6 patient is a happy hypoxic however still on significant amount of O2.  Patient does well in the SPO2 low 80s, can eat, will talk and joke with you.  In the a.m. will DC O2 and see how low patient SPO2 actually drops -8/7 warned patient that he is a happy hypoxic and that he should not ambulate around the room without assistance as he has a very narrow window between functioning and when he may pass out. -8/7 ambulatory SPO2 pending -An appointment has been made for you at the [facility] on Friday the 13th at 9am. They will assist you with finding a PCP. SATURATION QUALIFICATIONS: (Thisnote is usedto comply with regulatory documentation for home oxygen) Patient Saturations on Room Air at Rest =81% Patient Saturations on [employer] while Ambulating =74% Patient Saturations on4Liters of oxygen while Ambulating = 86% Please briefly explain why patient needs home oxygen:Patient had desaturation to 74% while walking from bed to the sink without any O2 -Patient meets criteria for home O2 -4 L of  O2, titrate to maintain SPO2> 88% -Provide Inogen home O2 concentrator -Counseled patient that he will be considered contagious until last 8/24  Elevated D-dimer -VQ scan with no evidence of PE, will change full dose anticoagulation to prophylaxis dose  Acute kidney injury: Recent Labs  Lab 06/21/20 0346 06/22/20 0429 06/23/20 0432 06/24/20 0721 06/25/20 0843  CREATININE 1.23 1.10 1.27* 1.03 1.15  -Baseline  Bacteremia vs contamination -Patient had 1/2 blood cultures come back with positive Diphtheroids (corynebacterium species), staph hemolyticus, staph epidermidis.  Most likely a contaminant. -However patient received 7 days of antibiotics which will have covered above species.   Transaminitis: Acute.  -Due to Covid, trending down.   Principal Problem:   Acute respiratory failure with hypoxia (HCC) Active Problems:   Pneumonia due to COVID-19 virus   AKI (acute kidney injury) (HCC)   Elevated d-dimer   Transaminitis   Discharge Condition: Stable  Diet recommendation: Stable  Filed Weights   06/19/20 2356 06/21/20 0444 06/23/20 0409  Weight: 122.5 kg 120.1 kg 120 kg    History of present illness:  44 y.o.BM PMHx no significant past medical history   diagnosed with COVID-19 7/28 presents withprogressively worseningshortness of breath. Symptoms initially started 12 days ago and tested positive for COVID-19 virus 5 days ago. Complains of associated symptoms of subjective fevers, chest pain, generalized malaise, intermittently productive cough, insomnia, and poor appetite. Denies having any nausea, vomiting, diarrhea, calf pain symptoms. He denies having any medical problems, but does not regularly see a primary  care physician. Notes that he never got around to get the COVID-19 vaccine. He notes that none of his family lives nearbyand he willupdate them. -Upon admission into the emergency department patient was seen to have a temperature of 100.3 F,  pulse 95-1 04, respirations 28-37, and O2 saturations as low as 35% and currently lower 90s on nonrebreather and he did high flow nasal cannula oxygen. Labs noted CBC within normal limits, sodium 133, BUN 22, creatinine 1.59, glucose 152, AST 123, ALT 97, lactic acid 1.9, LDH 588, D-dimer 1.13,andfibrinogen 571. Patient was started on remdesivir and given Decadron 6 mg IV.  Hospital Course:  See above  Cultures  8/2 SARS coronavirus positive 8/2 HIV screen negative 8/2 hepatitis B surface antigen negative 8/2 blood LEFT AC positive Diphtheroids (corynebacterium species), staph hemolyticus, staph epidermidis 8/2 blood RIGHT AC negative final   Antibiotics Anti-infectives (From admission, onward)   Start     Ordered Stop   06/21/20 1400  azithromycin (ZITHROMAX) tablet 500 mg  Status:  Discontinued        06/21/20 0945 06/22/20 1505   06/20/20 1000  remdesivir 100 mg in sodium chloride 0.9 % 100 mL IVPB       "Followed by" Linked Group Details   06/19/20 0942 06/24/20 0734   06/19/20 1400  cefTRIAXone (ROCEPHIN) 2 g in sodium chloride 0.9 % 100 mL IVPB  Status:  Discontinued        06/19/20 1330 06/22/20 1505   06/19/20 1400  azithromycin (ZITHROMAX) 500 mg in sodium chloride 0.9 % 250 mL IVPB  Status:  Discontinued        06/19/20 1330 06/21/20 0945   06/19/20 1000  remdesivir 200 mg in sodium chloride 0.9% 250 mL IVPB       "Followed by" Linked Group Details   06/19/20 0942 06/19/20 1116       Discharge Exam: Vitals:   06/25/20 2000 06/26/20 0011 06/26/20 0437 06/26/20 0805  BP: 128/79 133/79 130/86 125/90  Pulse: 87 84 83 82  Resp:  Temp:  98.3 F (36.8 C) 98.6 F (37 C) 97.9 F (36.6 C)  TempSrc:  Oral Oral Oral  SpO2: 93% (!) 89% (!) 89% (!) 83%  Weight:      Height:        General: A/O x4, positive acute respiratory distress Eyes: negative scleral hemorrhage, negative anisocoria, negative icterus ENT: Negative Runny nose, negative gingival  bleeding, Neck:  Negative scars, masses, torticollis, lymphadenopathy, JVD Lungs: decreased breath sounds bilaterally without wheezes or crackles Cardiovascular: Regular rate and rhythm without murmur gallop or rub normal S1 and S2 Abdomen: negative abdominal pain, nondistended, positive soft, bowel sounds, no rebound, no ascites, no appreciable mass  Discharge Instructions   Allergies as of 06/26/2020   No Known Allergies     Medication List    TAKE these medications   albuterol 108 (90 Base) MCG/ACT inhaler Commonly known as: VENTOLIN HFA Inhale 2 puffs into the lungs 3 (three) times daily.   ascorbic acid 500 MG tablet Commonly known as: VITAMIN C Take 1 tablet (500 mg total) by mouth daily.   dexamethasone 0.5 MG tablet Commonly known as: Decadron Take 1 tablet (0.5 mg total) by mouth daily.   guaiFENesin-dextromethorphan 100-10 MG/5ML syrup Commonly known as: ROBITUSSIN DM Take 10 mLs by mouth every 4 (four) hours as needed for cough.   Multi-Vitamin Gummies Chew Chew 1 Dose by mouth daily.   pantoprazole 40 MG tablet Commonly known  as: PROTONIX Take 1 tablet (40 mg total) by mouth daily.   zinc sulfate 220 (50 Zn) MG capsule Take 1 capsule (220 mg total) by mouth daily.            Durable Medical Equipment  (From admission, onward)         Start     Ordered   06/25/20 1523  For home use only DME oxygen  Once       Comments: SATURATION QUALIFICATIONS: (This note is used to comply with regulatory documentation for home oxygen) Patient Saturations on Room Air at Rest = 81% Patient Saturations on Room Air while Ambulating = 74% Patient Saturations on 4 Liters of oxygen while Ambulating = 86% Please briefly explain why patient needs home oxygen: Patient had desaturation to 74% while walking from bed to the sink without any O2 -Patient meets criteria for home O2 -4 L of O2, titrate to maintain SPO2> 88% -Provide Inogen home O2 concentrator  Question Answer  Comment  Length of Need Lifetime   Mode or (Route) Nasal cannula   Liters per Minute 4   Frequency Continuous (stationary and portable oxygen unit needed)   Oxygen conserving device Yes   Oxygen delivery system Gas      06/25/20 1522         No Known Allergies  Follow-up Information    [facility] on 06/30/2020.   Specialty: Family Medicine Why: [facility] the [facility] at 9am. They will assist you with finding a PCP.   Contact information: [address] [place] [id number] [phone]               The results of significant diagnostics from this hospitalization (including imaging, microbiology, ancillary and laboratory) are listed below for reference.    Significant Diagnostic Studies: NM Pulmonary Perf and Vent  Result Date: 06/19/2020 CLINICAL DATA:  COVID-19 positive 06/14/2020, concern for pulmonary embolism EXAM: NUCLEAR MEDICINE PERFUSION LUNG SCAN TECHNIQUE: Perfusion images were obtained in multiple projections after intravenous injection of radiopharmaceutical. Ventilation scans intentionally deferred if perfusion scan and chest x-ray adequate for interpretation during COVID 19 epidemic. RADIOPHARMACEUTICALS:  4.2 mCi Tc-3161m MAA IV COMPARISON:  Radiograph 06/19/2020 FINDINGS: No segmental perfusion defects are identified within the lungs. Demonstrated perfusion pattern corresponds well to the lung volumes seen on comparison chest radiograph from the same day. IMPRESSION: Pulmonary embolism absent per the perfusion-only modified PIOPED II criteria. Electronically Signed   By: Kreg ShropshirePrice  DeHay M.D.   On: 06/19/2020 16:03   DG Chest Port 1 View  Result Date: 06/19/2020 CLINICAL DATA:  Shortness of breath, COVID positive EXAM: PORTABLE CHEST 1 VIEW COMPARISON:  None FINDINGS: Trachea midline.  Lung volumes are low. Basilar consolidation on the LEFT with some areas of peripheral  opacity in the LEFT chest lateral to the heart border. Linear opacities also in the RIGHT mid chest. No acute musculoskeletal process to the extent evaluated. IMPRESSION: Multifocal opacities a pattern that could be seen in the setting multifocal infection or in the setting of COVID-19, asymmetric and more dense opacification in the retrocardiac region on today's exam. Electronically Signed   By: Donzetta KohutGeoffrey  Wile M.D.   On: 06/19/2020 10:00    Microbiology: Recent Results (from the past 240 hour(s))  Blood Culture (routine x 2)     Status: Abnormal   Collection Time: 06/19/20  9:00 AM   Specimen: BLOOD  Result Value Ref Range Status   Specimen Description BLOOD LEFT ANTECUBITAL  Final   Special Requests   Final    BOTTLES DRAWN AEROBIC AND ANAEROBIC Blood Culture adequate volume   Culture  Setup Time   Final    GRAM POSITIVE COCCI IN CLUSTERS GRAM POSITIVE RODS IN BOTH AEROBIC AND ANAEROBIC BOTTLES CRITICAL RESULT CALLED TO, READ BACK BY AND VERIFIED WITH: A. Artis Flock PharmD 8:45 06/20/20 ([email or web address])    Culture (A)  Final    DIPHTHEROIDS(CORYNEBACTERIUM SPECIES) STAPHYLOCOCCUS HAEMOLYTICUS STAPHYLOCOCCUS EPIDERMIDIS THE SIGNIFICANCE OF ISOLATING THIS ORGANISM FROM A SINGLE SET OF BLOOD CULTURES WHEN MULTIPLE SETS ARE DRAWN IS UNCERTAIN. PLEASE NOTIFY THE MICROBIOLOGY DEPARTMENT WITHIN ONE WEEK IF SPECIATION AND SENSITIVITIES ARE REQUIRED. Performed at [facility], [address], [place], [place] [address]    Report Status 06/23/2020 FINAL  Final  Blood Culture ID Panel (Reflexed)     Status: Abnormal   Collection Time: 06/19/20  9:00 AM  Result Value Ref Range Status   Enterococcus faecalis NOT DETECTED NOT DETECTED Final   Enterococcus Faecium NOT DETECTED NOT DETECTED Final   Listeria monocytogenes NOT DETECTED NOT DETECTED Final   Staphylococcus species DETECTED (A) NOT DETECTED Final    Comment: CRITICAL RESULT CALLED TO, READ BACK BY AND VERIFIED WITH: A. Artis Flock PharmD 8:45  06/20/20 ([email or web address])    Staphylococcus aureus (BCID) NOT DETECTED NOT DETECTED Final   Staphylococcus epidermidis DETECTED (A) NOT DETECTED Final    Comment: CRITICAL RESULT CALLED TO, READ BACK BY AND VERIFIED WITH: A. Artis Flock PharmD 8:45 06/20/20 ([email or web address])    Staphylococcus lugdunensis NOT DETECTED NOT DETECTED Final   Streptococcus species NOT DETECTED NOT DETECTED Final   Streptococcus agalactiae NOT DETECTED NOT DETECTED Final   Streptococcus pneumoniae NOT DETECTED NOT DETECTED Final   Streptococcus pyogenes NOT DETECTED NOT DETECTED Final   A.calcoaceticus-baumannii NOT DETECTED NOT DETECTED Final   Bacteroides fragilis NOT DETECTED NOT DETECTED Final   Enterobacterales NOT DETECTED NOT DETECTED Final   Enterobacter cloacae complex NOT DETECTED NOT DETECTED Final   Escherichia coli NOT DETECTED NOT DETECTED Final   Klebsiella aerogenes NOT DETECTED NOT DETECTED Final   Klebsiella oxytoca NOT DETECTED NOT DETECTED Final   Klebsiella pneumoniae NOT DETECTED NOT DETECTED Final   Proteus species NOT DETECTED NOT DETECTED Final   Salmonella species NOT DETECTED NOT DETECTED Final   Serratia marcescens NOT DETECTED NOT DETECTED Final   Haemophilus influenzae NOT DETECTED NOT DETECTED Final   Neisseria meningitidis NOT DETECTED NOT DETECTED Final   Pseudomonas aeruginosa NOT DETECTED NOT DETECTED Final   Stenotrophomonas maltophilia NOT DETECTED NOT DETECTED Final   Candida albicans NOT DETECTED NOT DETECTED Final   Candida auris NOT DETECTED NOT DETECTED Final   Candida glabrata NOT DETECTED NOT DETECTED Final   Candida krusei NOT DETECTED NOT DETECTED Final   Candida parapsilosis NOT DETECTED NOT DETECTED Final   Candida tropicalis NOT DETECTED NOT DETECTED Final   Cryptococcus neoformans/gattii NOT DETECTED NOT DETECTED Final   Methicillin resistance mecA/C NOT DETECTED NOT DETECTED Final    Comment: Performed at [facility], [address] [place], [place] [address]  Blood  Culture (routine x 2)     Status: None   Collection Time: 06/19/20  9:08 AM   Specimen: BLOOD  Result Value Ref Range Status   Specimen Description BLOOD RIGHT ANTECUBITAL  Final   Special Requests   Final    BOTTLES DRAWN AEROBIC AND ANAEROBIC Blood Culture adequate volume  Culture   Final    NO GROWTH 5 DAYS Performed at [facility], [address], [place], [place] [address]    Report Status 06/24/2020 FINAL  Final  SARS Coronavirus 2 by RT PCR (hospital order, performed in [facility] hospital lab) Nasopharyngeal Nasopharyngeal Swab     Status: Abnormal   Collection Time: 06/19/20 10:38 AM   Specimen: Nasopharyngeal Swab  Result Value Ref Range Status   SARS Coronavirus 2 POSITIVE (A) NEGATIVE Final    Comment: RESULT CALLED TO, READ BACK BY AND VERIFIED WITH: Drema Halon RN 14:20 06/19/20 ([email or web address]) (NOTE) SARS-CoV-2 target nucleic acids are DETECTED  SARS-CoV-2 RNA is generally detectable in upper respiratory specimens  during the acute phase of infection.  Positive results are indicative  of the presence of the identified virus, but do not rule out bacterial infection or co-infection with other pathogens not detected by the test.  Clinical correlation with patient history and  other diagnostic information is necessary to determine patient infection status.  The expected result is negative.  Fact Sheet for Patients:   BoilerBrush.com.cy   Fact Sheet for Healthcare Providers:   https://pope.com/    This test is not yet approved or cleared by the [place] FDA and  has been authorized for detection and/or diagnosis of SARS-CoV-2 by FDA under an Emergency Use Authorization (EUA).  This EUA will remain in effect (meaning this t est can be used) for the duration of  the COVID-19 declaration under Section 564(b)(1) of the Act, 21 U.S.C. section 360-bbb-3(b)(1), unless the authorization is terminated or revoked  sooner.  Performed at [facility], [address], [place], [place] [address]   Expectorated sputum assessment w rflx to resp cult     Status: None   Collection Time: 06/21/20  9:16 AM   Specimen: Expectorated Sputum  Result Value Ref Range Status   Specimen Description Expect. Sput  Final   Special Requests Normal  Final   Sputum evaluation   Final    THIS SPECIMEN IS ACCEPTABLE FOR SPUTUM CULTURE Performed at [facility], [address], [place], [place] [address]    Report Status 06/21/2020 FINAL  Final  Culture, respiratory     Status: None   Collection Time: 06/21/20  9:16 AM  Result Value Ref Range Status   Specimen Description Expect. Sput  Final   Special Requests Normal Reflexed from [id number]  Final   Gram Stain   Final    RARE WBC PRESENT, PREDOMINANTLY PMN FEW GRAM POSITIVE COCCI IN PAIRS IN CHAINS RARE GRAM NEGATIVE RODS RARE GRAM POSITIVE RODS    Culture   Final    MODERATE Normal respiratory flora-no Staph aureus or Pseudomonas seen Performed at [facility], [address] [place], [place] [address]    Report Status 06/23/2020 FINAL  Final     Labs: Basic Metabolic Panel: Recent Labs  Lab 06/21/20 0346 06/22/20 0429 06/23/20 0432 06/24/20 0721 06/25/20 0843  NA 138 138 137 135 136  K 3.9 3.8 4.5 4.1 4.3  CL 99 99 99 102 101  CO2 28 28 29 23 23   GLUCOSE 186* 169* 137* 137* 148*  BUN 25* 22* 21* 20 21*  CREATININE 1.23 1.10 1.27* 1.03 1.15  CALCIUM 8.5* 8.5* 8.3* 8.6* 8.4*  MG 2.4 2.4 2.3 2.2 2.3  PHOS 2.9 3.0 3.1 2.9 2.8   Liver Function Tests: Recent Labs  Lab 06/21/20 0346 06/22/20 0429 06/23/20 0432 06/24/20 08/24/20  06/25/20 0843  AST 59* 44* 41 34 35  ALT 79* 72* 69* 74* 74*  ALKPHOS 50 46 53 52 51  BILITOT 0.9 0.9 0.8 1.0 0.8  PROT 6.3* 6.4* 5.8* 6.3* 6.0*  ALBUMIN 3.0* 3.0* 2.9* 3.2* 3.0*   No results for input(s): LIPASE, AMYLASE in the last 168 hours. No results for input(s): AMMONIA in the last 168  hours. CBC: Recent Labs  Lab 06/21/20 0346 06/22/20 0429 06/23/20 0432 06/24/20 0721 06/25/20 0843  WBC 5.6 11.3* 15.6* 19.4* 18.9*  NEUTROABS 4.1 9.0* 12.2* 15.4* 16.3*  HGB 15.0 15.1 14.8 16.4 16.1  HCT 44.5 45.5 45.4 49.9 48.9  MCV 86.2 86.8 87.3 85.9 86.2  PLT 343 357 402* 420* 434*   Cardiac Enzymes: No results for input(s): CKTOTAL, CKMB, CKMBINDEX, TROPONINI in the last 168 hours. BNP: BNP (last 3 results) No results for input(s): BNP in the last 8760 hours.  ProBNP (last 3 results) No results for input(s): PROBNP in the last 8760 hours.  CBG: Recent Labs  Lab 06/20/20 2057 06/25/20 2153  GLUCAP 187* 197*       Signed:  Carolyne Littles, MD [facility] [phone] pager

## 2020-06-30 ENCOUNTER — Ambulatory Visit (INDEPENDENT_AMBULATORY_CARE_PROVIDER_SITE_OTHER): Payer: HRSA Program | Admitting: Nurse Practitioner

## 2020-06-30 VITALS — BP 142/84 | HR 90 | Temp 97.0°F | Ht 72.0 in | Wt 262.0 lb

## 2020-06-30 DIAGNOSIS — N179 Acute kidney failure, unspecified: Secondary | ICD-10-CM

## 2020-06-30 DIAGNOSIS — J1282 Pneumonia due to coronavirus disease 2019: Secondary | ICD-10-CM | POA: Diagnosis not present

## 2020-06-30 DIAGNOSIS — J9601 Acute respiratory failure with hypoxia: Secondary | ICD-10-CM

## 2020-06-30 DIAGNOSIS — U071 COVID-19: Secondary | ICD-10-CM | POA: Diagnosis not present

## 2020-06-30 NOTE — Progress Notes (Signed)
@Patient  ID: , male    DOB: [birth year], 44 y.o.   MRN: [id number]  Chief Complaint  Patient presents with  . Hospitalization Follow-up    8/2-8/9 for COVID; weakness and SOB on 4L of O2 PRN.     Referring provider: No ref. provider found   44 year old male with no significant health history. Diagnosed with Covid 8/21  06/19/20 - 06/26/20 Hospital Admission:   -Patient received Actemraon admission. -IV Solu-Medrol. 60 mg BID -Completed course ofIVRemdesivir. -Continue to trend inflammatory markers closely -Procalcitonin<0.10 DC antibiotics -8/6 patient is a happy hypoxic however still on significant amount of O2. Patient does well in the SPO2 low 80s, can eat, will talk and joke with you. In the a.m. will DC O2 and see how low patient SPO2 actually drops -8/7 warned patient that he is a happy hypoxic and that he should not ambulate around the room without assistance as he has a very narrow window between functioning and when he may pass out. -8/7 ambulatory SPO2 pending -An appointment has been [facility] for you [facility] on[facility] at 9am.  HPI  Patient presents today for post Covid care clinic/hospital follow-up.  Patient was admitted to the hospital on 06/19/2020 and discharged on 06/26/2020.  Please see hospital course above.  Patient states that since hospital discharge he has been doing well.  He has been compliant with his incentive spirometer and flutter valve device.  He has been compliant with medications.  He has completed his Decadron course.  He admits that he has not been checking his O2 sats regularly.  He does use oxygen at times if he feels that he needs it.  He has been trying to walk short distances daily and build up distance and endurance. Patient states that he uses his inhaler once daily.  Denies f/c/s, n/v/d, hemoptysis, PND, chest pain or edema.       No Known Allergies   There is no immunization history on file for this  patient.  History reviewed. No pertinent past medical history.  Tobacco History: Social History   Tobacco Use  Smoking Status Never Smoker  Smokeless Tobacco Never Used   Counseling given: Not Answered   Outpatient Encounter Medications as of 06/30/2020  Medication Sig  . albuterol (VENTOLIN HFA) 108 (90 Base) MCG/ACT inhaler Inhale 2 puffs into the lungs 3 (three) times daily.  07/02/2020 ascorbic acid (VITAMIN C) 500 MG tablet Take 1 tablet (500 mg total) by mouth daily.  Marland Kitchen dexamethasone (DECADRON) 0.5 MG tablet Take 1 tablet (0.5 mg total) by mouth daily.  Marland Kitchen guaiFENesin-dextromethorphan (ROBITUSSIN DM) 100-10 MG/5ML syrup Take 10 mLs by mouth every 4 (four) hours as needed for cough.  . Multiple Vitamins-Minerals (MULTI-VITAMIN GUMMIES) CHEW Chew 1 Dose by mouth daily.  . pantoprazole (PROTONIX) 40 MG tablet Take 1 tablet (40 mg total) by mouth daily.  Marland Kitchen zinc sulfate 220 (50 Zn) MG capsule Take 1 capsule (220 mg total) by mouth daily.   No facility-administered encounter medications on file as of 06/30/2020.     Review of Systems  Review of Systems  Constitutional: Negative.  Negative for chills and fever.  HENT: Negative.   Respiratory: Negative for cough, shortness of breath and wheezing.   Cardiovascular: Negative.  Negative for chest pain, palpitations and leg swelling.  Gastrointestinal: Negative.   Allergic/Immunologic: Negative.   Neurological: Negative.   Psychiatric/Behavioral: Negative.        Physical Exam  BP (!) 142/84 (  BP Location: Right Arm, Patient Position: Sitting, Cuff Size: Small)   Pulse 90   Temp (!) 97 F (36.1 C)   Ht 6' (1.829 m)   Wt 262 lb 0.1 oz (118.8 kg)   SpO2 91%   BMI 35.53 kg/m   Wt Readings from Last 5 Encounters:  06/30/20 262 lb 0.1 oz (118.8 kg)  06/23/20 264 lb 8.8 oz (120 kg)     Physical Exam Vitals and nursing note reviewed.  Constitutional:      General: He is not in acute distress.    Appearance: He is  well-developed.  Cardiovascular:     Rate and Rhythm: Normal rate and regular rhythm.  Pulmonary:     Effort: Pulmonary effort is normal.     Comments: Lung sounds diminished throughout Skin:    General: Skin is warm and dry.  Neurological:     Mental Status: He is alert and oriented to person, place, and time.      Imaging: NM Pulmonary Perf and Vent  Result Date: 06/19/2020 CLINICAL DATA:  COVID-19 positive 06/14/2020, concern for pulmonary embolism EXAM: NUCLEAR MEDICINE PERFUSION LUNG SCAN TECHNIQUE: Perfusion images were obtained in multiple projections after intravenous injection of radiopharmaceutical. Ventilation scans intentionally deferred if perfusion scan and chest x-ray adequate for interpretation during COVID 19 epidemic. RADIOPHARMACEUTICALS:  4.2 mCi Tc-43m MAA IV COMPARISON:  Radiograph 06/19/2020 FINDINGS: No segmental perfusion defects are identified within the lungs. Demonstrated perfusion pattern corresponds well to the lung volumes seen on comparison chest radiograph from the same day. IMPRESSION: Pulmonary embolism absent per the perfusion-only modified PIOPED II criteria. Electronically Signed   By: Kreg Shropshire M.D.   On: 06/19/2020 16:03   DG Chest Port 1 View  Result Date: 06/19/2020 CLINICAL DATA:  Shortness of breath, COVID positive EXAM: PORTABLE CHEST 1 VIEW COMPARISON:  None FINDINGS: Trachea midline.  Lung volumes are low. Basilar consolidation on the LEFT with some areas of peripheral opacity in the LEFT chest lateral to the heart border. Linear opacities also in the RIGHT mid chest. No acute musculoskeletal process to the extent evaluated. IMPRESSION: Multifocal opacities a pattern that could be seen in the setting multifocal infection or in the setting of COVID-19, asymmetric and more dense opacification in the retrocardiac region on today's exam. Electronically Signed   By: Donzetta Kohut M.D.   On: 06/19/2020 10:00     Assessment & Plan:   Pneumonia due  to COVID-19 virus Acute respiratory failure with hypoxia:  Glad you are improving!  Patient walked in office today - O2 sats dropped to 81% on RA  - patient placed on 2 L  and sats went up to 91%  Please wear 2-3L of O2 with exertion to keep sats above 90% - may stay at RA when at rest  Continue current medications  Stay active  Continue deep breathing exercises  Stay well hydrated  Hand out given to patient with a list of local PCP's   AKI:  Will check follow up labs at next visit  Stay well hydrated   Follow up:  Follow up in 2 weeks - will need follow up chest x ray and labs      Ivonne Andrew, NP 06/30/2020

## 2020-06-30 NOTE — Assessment & Plan Note (Signed)
Acute respiratory failure with hypoxia:  Glad you are improving!  Patient walked in office today - O2 sats dropped to 81% on RA  - patient placed on 2 L [place] and sats went up to 91%  Please wear 2-3L of O2 with exertion to keep sats above 90% - may stay at RA when at rest  Continue current medications  Stay active  Continue deep breathing exercises  Stay well hydrated  Hand out given to patient with a list of local PCP's   AKI:  Will check follow up labs at next visit  Stay well hydrated   Follow up:  Follow up in 2 weeks - will need follow up chest x ray and labs

## 2020-06-30 NOTE — Patient Instructions (Addendum)
Covid Pneumonia Acute respiratory failure with hypoxia:  Glad you are improving!  Patient walked in office today - O2 sats dropped to 81% on RA  - patient placed on 2 L [place] and sats went up to 91%  Please wear 2-3L of O2 with exertion to keep sats above 90% - may stay at RA when at rest  Continue current medications  Stay active  Continue deep breathing exercises  Stay well hydrated  Hand out given to patient with a list of local PCP's   AKI:  Will check follow up labs at next visit  Stay well hydrated   Follow up:  Follow up in 2 weeks - will need follow up chest x ray and labs

## 2020-07-13 ENCOUNTER — Ambulatory Visit: Payer: Self-pay

## 2020-08-21 ENCOUNTER — Other Ambulatory Visit: Payer: Self-pay

## 2020-08-21 ENCOUNTER — Emergency Department (HOSPITAL_COMMUNITY)
Admission: EM | Admit: 2020-08-21 | Discharge: 2020-08-21 | Disposition: A | Discharge status: Home / Self Care | Payer: Self-pay | Attending: Emergency Medicine | Admitting: Emergency Medicine

## 2020-08-21 DIAGNOSIS — Z8616 Personal history of covid-19: Secondary | ICD-10-CM | POA: Diagnosis not present

## 2020-08-21 DIAGNOSIS — M791 Myalgia, unspecified site: Secondary | ICD-10-CM

## 2020-08-21 DIAGNOSIS — M7918 Myalgia, other site: Secondary | ICD-10-CM | POA: Insufficient documentation

## 2020-08-21 MED ORDER — METHOCARBAMOL 500 MG PO TABS: 500.0000 mg | ORAL_TABLET | Freq: Two times a day (BID) | ORAL | 0 refills | Status: AC

## 2020-08-21 MED ORDER — LIDOCAINE 5 % EX PTCH: 1.0000 | MEDICATED_PATCH | CUTANEOUS | 0 refills | Status: AC

## 2020-08-21 NOTE — Discharge Instructions (Signed)
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
